# Patient Record
Sex: Female | Born: 1965 | Race: Black or African American | Hispanic: No | Marital: Single | Smoking: Current every day smoker
Health system: Southern US, Community
[De-identification: ages and names within clinical notes are randomized; demographics above are authoritative.]

## PROBLEM LIST (undated history)

## (undated) DIAGNOSIS — E876 Hypokalemia: Secondary | ICD-10-CM

## (undated) DIAGNOSIS — I471 Supraventricular tachycardia, unspecified: Secondary | ICD-10-CM

## (undated) DIAGNOSIS — I10 Essential (primary) hypertension: Secondary | ICD-10-CM

## (undated) DIAGNOSIS — K219 Gastro-esophageal reflux disease without esophagitis: Secondary | ICD-10-CM

## (undated) DIAGNOSIS — I4719 Other supraventricular tachycardia: Secondary | ICD-10-CM

## (undated) DIAGNOSIS — I4891 Unspecified atrial fibrillation: Secondary | ICD-10-CM

## (undated) HISTORY — PX: APPENDECTOMY: SHX54

## (undated) HISTORY — PX: ABDOMINAL HYSTERECTOMY: SHX81

## (undated) HISTORY — PX: EYE SURGERY: SHX253

---

## 2008-03-15 ENCOUNTER — Emergency Department (HOSPITAL_BASED_OUTPATIENT_CLINIC_OR_DEPARTMENT_OTHER): Admission: EM | Admit: 2008-03-15 | Discharge: 2008-03-15 | Payer: Self-pay | Admitting: Emergency Medicine

## 2008-07-21 ENCOUNTER — Emergency Department (HOSPITAL_BASED_OUTPATIENT_CLINIC_OR_DEPARTMENT_OTHER): Admission: EM | Admit: 2008-07-21 | Discharge: 2008-07-21 | Payer: Self-pay | Admitting: Emergency Medicine

## 2008-10-22 ENCOUNTER — Emergency Department (HOSPITAL_BASED_OUTPATIENT_CLINIC_OR_DEPARTMENT_OTHER): Admission: EM | Admit: 2008-10-22 | Discharge: 2008-10-22 | Payer: Self-pay | Admitting: Emergency Medicine

## 2009-02-11 ENCOUNTER — Emergency Department (HOSPITAL_BASED_OUTPATIENT_CLINIC_OR_DEPARTMENT_OTHER): Admission: EM | Admit: 2009-02-11 | Discharge: 2009-02-11 | Payer: Self-pay | Admitting: Emergency Medicine

## 2009-02-21 ENCOUNTER — Emergency Department (HOSPITAL_BASED_OUTPATIENT_CLINIC_OR_DEPARTMENT_OTHER): Admission: EM | Admit: 2009-02-21 | Discharge: 2009-02-21 | Payer: Self-pay | Admitting: Emergency Medicine

## 2010-04-14 LAB — URINALYSIS, ROUTINE W REFLEX MICROSCOPIC
Bilirubin Urine: NEGATIVE
Glucose, UA: NEGATIVE mg/dL
Ketones, ur: NEGATIVE mg/dL
Nitrite: NEGATIVE
Protein, ur: 100 mg/dL — AB
Protein, ur: NEGATIVE mg/dL
Urobilinogen, UA: 1 mg/dL (ref 0.0–1.0)
pH: 7 (ref 5.0–8.0)

## 2010-04-14 LAB — URINE CULTURE: Colony Count: >=100000

## 2010-04-14 LAB — URINE MICROSCOPIC-ADD ON

## 2011-03-02 ENCOUNTER — Emergency Department (HOSPITAL_BASED_OUTPATIENT_CLINIC_OR_DEPARTMENT_OTHER)
Admission: EM | Admit: 2011-03-02 | Discharge: 2011-03-02 | Disposition: A | Discharge status: Home / Self Care | Payer: PRIVATE HEALTH INSURANCE | Attending: Emergency Medicine | Admitting: Emergency Medicine

## 2011-03-02 ENCOUNTER — Encounter (HOSPITAL_BASED_OUTPATIENT_CLINIC_OR_DEPARTMENT_OTHER): Payer: Self-pay | Admitting: *Deleted

## 2011-03-02 DIAGNOSIS — I10 Essential (primary) hypertension: Secondary | ICD-10-CM | POA: Insufficient documentation

## 2011-03-02 DIAGNOSIS — A599 Trichomoniasis, unspecified: Secondary | ICD-10-CM | POA: Insufficient documentation

## 2011-03-02 DIAGNOSIS — N390 Urinary tract infection, site not specified: Secondary | ICD-10-CM | POA: Insufficient documentation

## 2011-03-02 DIAGNOSIS — F17200 Nicotine dependence, unspecified, uncomplicated: Secondary | ICD-10-CM | POA: Insufficient documentation

## 2011-03-02 DIAGNOSIS — M549 Dorsalgia, unspecified: Secondary | ICD-10-CM | POA: Insufficient documentation

## 2011-03-02 HISTORY — DX: Essential (primary) hypertension: I10

## 2011-03-02 LAB — URINE MICROSCOPIC-ADD ON

## 2011-03-02 LAB — URINALYSIS, ROUTINE W REFLEX MICROSCOPIC
Bilirubin Urine: NEGATIVE
Ketones, ur: NEGATIVE mg/dL
Nitrite: NEGATIVE
Protein, ur: NEGATIVE mg/dL
pH: 6 (ref 5.0–8.0)

## 2011-03-02 LAB — WET PREP, GENITAL: Yeast Wet Prep HPF POC: NONE SEEN

## 2011-03-02 MED ORDER — CEFTRIAXONE SODIUM 250 MG IJ SOLR
250.0000 mg | Freq: Once | INTRAMUSCULAR | Status: AC
  Administered 2011-03-02: 250 mg via INTRAMUSCULAR
  Filled 2011-03-02: qty 250

## 2011-03-02 MED ORDER — AZITHROMYCIN 1 G PO PACK
1.0000 g | PACK | Freq: Once | ORAL | Status: AC
  Administered 2011-03-02: 1 g via ORAL
  Filled 2011-03-02: qty 1

## 2011-03-02 MED ORDER — DOXYCYCLINE HYCLATE 100 MG PO CAPS: 100.0000 mg | ORAL_CAPSULE | Freq: Two times a day (BID) | ORAL | Status: AC

## 2011-03-02 MED ORDER — METOPROLOL TARTRATE 50 MG PO TABS: 50.0000 mg | ORAL_TABLET | Freq: Two times a day (BID) | ORAL | Status: DC

## 2011-03-02 MED ORDER — ONDANSETRON 8 MG PO TBDP
8.0000 mg | ORAL_TABLET | Freq: Once | ORAL | Status: AC
  Administered 2011-03-02: 8 mg via ORAL
  Filled 2011-03-02: qty 1

## 2011-03-02 MED ORDER — LIDOCAINE HCL (PF) 1 % IJ SOLN
INTRAMUSCULAR | Status: AC
  Administered 2011-03-02: 2.1 mL
  Filled 2011-03-02: qty 5

## 2011-03-02 MED ORDER — METRONIDAZOLE 500 MG PO TABS
2000.0000 mg | ORAL_TABLET | Freq: Once | ORAL | Status: AC
  Administered 2011-03-02: 2000 mg via ORAL
  Filled 2011-03-02: qty 4

## 2011-03-02 NOTE — ED Provider Notes (Signed)
History     CSN: [id number]  Arrival date & time 03/02/11  0435   First MD Initiated Contact with Patient 03/02/11 0448      Chief Complaint  Patient presents with  . Back Pain  . Vaginal Discharge    (Consider location/radiation/quality/duration/timing/severity/associated sxs/prior treatment) HPI This 46 year old female states she ran out of her blood pressure medications a couple years ago has not taken any since that time but recently got insurance and wants to restart blood pressure medications, she also has a chronic vaginal discharge for several months if not longer and was diagnosed with bacterial vaginosis but never got a prescription filled for that, and she also states that after she's been working for 12 hours standing still on a concrete floor her low back starts to hurt but it is not hurting her this morning and she just got off work, when she does have back pain it can last for hours but does not radiate down her legs and is not associated with weakness or numbness or change in bowel or bladder function she also has no abdominal pain chest pain cough shortness of breath rash fevers or other concerns. She has no headache or confusion no change in speech or vision. Past Medical History  Diagnosis Date  . Hypertension     Past Surgical History  Procedure Date  . Abdominal hysterectomy   . Appendectomy     History reviewed. No pertinent family history.  History  Substance Use Topics  . Smoking status: Current Everyday Smoker  . Smokeless tobacco: Not on file  . Alcohol Use: No    OB History    Grav Para Term Preterm Abortions TAB SAB Ect Mult Living                  Review of Systems  Constitutional: Negative for fever.       10 Systems reviewed and are negative for acute change except as noted in the HPI.  HENT: Negative for congestion.   Eyes: Negative for discharge and redness.  Respiratory: Negative for cough and shortness of breath.   Cardiovascular:  Negative for chest pain.  Gastrointestinal: Negative for vomiting, abdominal pain and diarrhea.  Genitourinary: Positive for vaginal discharge. Negative for dysuria, hematuria, flank pain and vaginal bleeding.  Musculoskeletal: Positive for back pain.  Skin: Negative for rash.  Neurological: Negative for dizziness, syncope, facial asymmetry, speech difficulty, weakness, numbness and headaches.  Psychiatric/Behavioral:       No behavior change.    Allergies  Review of patient's allergies indicates not on file.  Home Medications   Current Outpatient Rx  Name Route Sig Dispense Refill  . DOXYCYCLINE HYCLATE 100 MG PO CAPS Oral Take 1 capsule (100 mg total) by mouth 2 (two) times daily. One po bid x 7 days 14 capsule 0  . METOPROLOL TARTRATE 50 MG PO TABS Oral Take 1 tablet (50 mg total) by mouth 2 (two) times daily. 60 tablet 0    BP 186/108  Pulse 90  Temp(Src) 98.2 F (36.8 C) (Oral)  Resp 16  SpO2 100%  Physical Exam  Nursing note and vitals reviewed. Constitutional:       Awake, alert, nontoxic appearance.  HENT:  Head: Atraumatic.  Eyes: Right eye exhibits no discharge. Left eye exhibits no discharge.  Neck: Neck supple.  Cardiovascular: Normal rate and regular rhythm.   No murmur heard. Pulmonary/Chest: Effort normal and breath sounds normal. No respiratory distress. She has no wheezes. She has  no rales. She exhibits no tenderness.  Abdominal: Soft. Bowel sounds are normal. She exhibits no mass. There is no tenderness. There is no rebound and no guarding.       Back is nontender  Genitourinary: Vaginal discharge found.       Chaperone is present with greenish yellow discharge present in the vaginal vault with no tenderness to bimanual examination or masses palpated  Musculoskeletal: She exhibits no tenderness.       Baseline ROM, no obvious new focal weakness.  Neurological: She is alert.       Mental status and motor strength appears baseline for patient and  situation.  Skin: No rash noted.  Psychiatric: She has a normal mood and affect.    ED Course  Procedures (including critical care time)  Labs Reviewed  URINALYSIS, ROUTINE W REFLEX MICROSCOPIC - Abnormal; Notable for the following:    APPearance CLOUDY (*)    Hgb urine dipstick SMALL (*)    Leukocytes, UA LARGE (*)    All other components within normal limits  WET PREP, GENITAL - Abnormal; Notable for the following:    Trich, Wet Prep MANY (*)    Clue Cells Wet Prep HPF POC FEW (*)    WBC, Wet Prep HPF POC TOO NUMEROUS TO COUNT (*) BACTERIA- TOO NUMEROUS TO COUNT   All other components within normal limits  URINE MICROSCOPIC-ADD ON - Abnormal; Notable for the following:    Bacteria, UA MANY (*)    All other components within normal limits  GC/CHLAMYDIA PROBE AMP, GENITAL   No results found.   1. Trichomoniasis   2. Urinary tract infection   3. Hypertension       MDM  I doubt any other EMC precluding discharge at this time including, but not necessarily limited to the following:sepsis.        Hurman Horn, MD 03/03/11 2138

## 2011-03-02 NOTE — ED Notes (Signed)
Pt states that she has had BV x 6 months and a kidney infection x 6 months and her back hurts "real bad"

## 2011-08-15 ENCOUNTER — Emergency Department (HOSPITAL_BASED_OUTPATIENT_CLINIC_OR_DEPARTMENT_OTHER)
Admission: EM | Admit: 2011-08-15 | Discharge: 2011-08-15 | Disposition: A | Discharge status: Home / Self Care | Payer: Self-pay | Attending: Emergency Medicine | Admitting: Emergency Medicine

## 2011-08-15 ENCOUNTER — Encounter (HOSPITAL_BASED_OUTPATIENT_CLINIC_OR_DEPARTMENT_OTHER): Payer: Self-pay | Admitting: *Deleted

## 2011-08-15 ENCOUNTER — Emergency Department (HOSPITAL_BASED_OUTPATIENT_CLINIC_OR_DEPARTMENT_OTHER): Payer: Self-pay

## 2011-08-15 DIAGNOSIS — Z9114 Patient's other noncompliance with medication regimen: Secondary | ICD-10-CM

## 2011-08-15 DIAGNOSIS — E876 Hypokalemia: Secondary | ICD-10-CM | POA: Insufficient documentation

## 2011-08-15 DIAGNOSIS — Z9089 Acquired absence of other organs: Secondary | ICD-10-CM | POA: Insufficient documentation

## 2011-08-15 DIAGNOSIS — F17200 Nicotine dependence, unspecified, uncomplicated: Secondary | ICD-10-CM | POA: Insufficient documentation

## 2011-08-15 DIAGNOSIS — Z9119 Patient's noncompliance with other medical treatment and regimen: Secondary | ICD-10-CM | POA: Insufficient documentation

## 2011-08-15 DIAGNOSIS — I10 Essential (primary) hypertension: Secondary | ICD-10-CM | POA: Insufficient documentation

## 2011-08-15 DIAGNOSIS — R002 Palpitations: Secondary | ICD-10-CM | POA: Insufficient documentation

## 2011-08-15 DIAGNOSIS — Z91199 Patient's noncompliance with other medical treatment and regimen due to unspecified reason: Secondary | ICD-10-CM | POA: Insufficient documentation

## 2011-08-15 HISTORY — DX: Hypokalemia: E87.6

## 2011-08-15 LAB — CBC WITH DIFFERENTIAL/PLATELET
Basophils Relative: 0 % (ref 0–1)
Eosinophils Absolute: 0.1 10*3/uL (ref 0.0–0.7)
HCT: 39.3 % (ref 36.0–46.0)
Hemoglobin: 13.8 g/dL (ref 12.0–15.0)
MCH: 31 pg (ref 26.0–34.0)
MCHC: 35.1 g/dL (ref 30.0–36.0)
Monocytes Absolute: 0.9 10*3/uL (ref 0.1–1.0)
Monocytes Relative: 9 % (ref 3–12)
Neutrophils Relative %: 45 % (ref 43–77)
RDW: 13 % (ref 11.5–15.5)

## 2011-08-15 LAB — BASIC METABOLIC PANEL
BUN: 13 mg/dL (ref 6–23)
Calcium: 9.3 mg/dL (ref 8.4–10.5)
Creatinine, Ser: 0.9 mg/dL (ref 0.50–1.10)
GFR calc Af Amer: 88 mL/min — ABNORMAL LOW (ref >90)
GFR calc non Af Amer: 76 mL/min — ABNORMAL LOW (ref >90)

## 2011-08-15 MED ORDER — HYDROCHLOROTHIAZIDE 25 MG PO TABS: 25.0000 mg | ORAL_TABLET | Freq: Every day | ORAL | Status: DC

## 2011-08-15 MED ORDER — HYDROCHLOROTHIAZIDE 25 MG PO TABS
25.0000 mg | ORAL_TABLET | Freq: Every day | ORAL | Status: DC
  Administered 2011-08-15: 25 mg via ORAL
  Filled 2011-08-15: qty 1

## 2011-08-15 MED ORDER — METOPROLOL TARTRATE 50 MG PO TABS
ORAL_TABLET | ORAL | Status: AC
  Administered 2011-08-15: 50 mg via ORAL
  Filled 2011-08-15: qty 1

## 2011-08-15 MED ORDER — POTASSIUM CHLORIDE CRYS ER 20 MEQ PO TBCR
40.0000 meq | EXTENDED_RELEASE_TABLET | Freq: Once | ORAL | Status: AC
  Administered 2011-08-15: 40 meq via ORAL
  Filled 2011-08-15: qty 1

## 2011-08-15 MED ORDER — METOPROLOL TARTRATE 12.5 MG HALF TABLET
12.5000 mg | ORAL_TABLET | Freq: Once | ORAL | Status: AC
  Administered 2011-08-15: 50 mg via ORAL
  Filled 2011-08-15: qty 1

## 2011-08-15 MED ORDER — ASPIRIN 81 MG PO CHEW
324.0000 mg | CHEWABLE_TABLET | Freq: Once | ORAL | Status: AC
  Administered 2011-08-15: 324 mg via ORAL
  Filled 2011-08-15: qty 4

## 2011-08-15 MED ORDER — POTASSIUM CHLORIDE ER 10 MEQ PO TBCR: 10.0000 meq | EXTENDED_RELEASE_TABLET | Freq: Two times a day (BID) | ORAL | Status: DC

## 2011-08-15 NOTE — ED Notes (Signed)
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

## 2011-08-15 NOTE — ED Notes (Signed)
Return from XR

## 2011-08-15 NOTE — ED Provider Notes (Signed)
History     CSN: [id number]  Arrival date & time 08/15/11  Sarah Larson   First MD Initiated Contact with Patient 08/15/11 2006      Chief Complaint  Patient presents with  . Chest Pain    (Consider location/radiation/quality/duration/timing/severity/associated sxs/prior treatment) HPI  Patient complaining of palpitations for the past year with some increase in the past couple days. She states that she has had some nausea and shortness of breath. She has cough at baseline and has had some increasing coughing. She is a smoker. She denies any chest pain to me. I have reviewed this as it was in the nurses notes. She states it is more that she feels a palpitation in her chest. She states she does get somewhat sweaty at times but has been out in the heat. She has been taking her antihypertensives had a decreased amount do to feeling bad after taking the Lopressor and not having the money to refill her medicine she is only taking her hydrochlorothiazide intermittently. She has had some headache and general body aches. She does have a history of hypokalemia and is concerned that her potassium is low.  Past Medical History  Diagnosis Date  . Hypertension   . Hypokalemia     Past Surgical History  Procedure Date  . Abdominal hysterectomy   . Appendectomy     No family history on file.  History  Substance Use Topics  . Smoking status: Current Everyday Smoker  . Smokeless tobacco: Not on file  . Alcohol Use: Yes     occasionally    OB History    Grav Para Term Preterm Abortions TAB SAB Ect Mult Living                  Review of Systems  All other systems reviewed and are negative.    Allergies  Review of patient's allergies indicates no known allergies.  Home Medications   Current Outpatient Rx  Name Route Sig Dispense Refill  . ACETAMINOPHEN 500 MG PO TABS Oral Take 500 mg by mouth every 6 (six) hours as needed. For headache.    Marland Kitchen HYDROCHLOROTHIAZIDE 25 MG PO TABS Oral Take  25 mg by mouth daily.      BP 159/91  Pulse 80  Temp 98.8 F (37.1 C) (Oral)  Resp 18  SpO2 100%  Physical Exam  Nursing note and vitals reviewed. Constitutional: She appears well-developed and well-nourished.  HENT:  Head: Normocephalic and atraumatic.  Eyes: Conjunctivae and EOM are normal. Pupils are equal, round, and reactive to light.  Neck: Normal range of motion. Neck supple.  Cardiovascular: Normal rate, regular rhythm, normal heart sounds and intact distal pulses.   Pulmonary/Chest: Effort normal and breath sounds normal.  Abdominal: Soft. Bowel sounds are normal.  Musculoskeletal: Normal range of motion.  Neurological: She is alert.  Skin: Skin is warm and dry.  Psychiatric: She has a normal mood and affect. Thought content normal.    ED Course  Procedures (including critical care time)  Labs Reviewed  CBC WITH DIFFERENTIAL - Abnormal; Notable for the following:    Lymphs Abs 4.1 (*)     All other components within normal limits  BASIC METABOLIC PANEL - Abnormal; Notable for the following:    Potassium 3.2 (*)     GFR calc non Af Amer 76 (*)     GFR calc Af Amer 88 (*)     All other components within normal limits  TROPONIN I   Dg  Chest 2 View  08/15/2011  *RADIOLOGY REPORT*  Clinical Data: Chest pain, palpitations  CHEST - 2 VIEW  Comparison: None.  Findings: Lungs are clear. No pleural effusion or pneumothorax.  Cardiomediastinal silhouette is within normal limits.  Visualized osseous structures are within normal limits.  IMPRESSION: No evidence of acute cardiopulmonary disease.  Original Report Authenticated By: Charline Bills, M.D.     No diagnosis found.   Rate: 87  Rhythm: normal sinus rhythm  QRS Axis: normal  Intervals: normal  ST/T Wave abnormalities: normal  Conduction Disutrbances: none  Narrative Interpretation: unremarkable   MDM  Patient with normal labs except for the potassium being low at 3.2. She'll have potassium replenishment  here and is given a prescription for this. She also received Lopressor and hydrochlorothiazide here. She initially was hypertensive and this is likely her baseline. I discussed the necessity of taking her medications obtaining followup. She is advised to stop smoking and take her medications as prescribed. She is advised to return if she has any worsening of her symptoms. I think it is not consistent with coronary artery disease or myocardial infarction at this time. Her EKG does not show any acute ischemic changes and she has not had any chest pain. Date: 08/15/2011           Hilario Quarry, MD 08/15/11 2106

## 2011-08-15 NOTE — ED Notes (Signed)
Intermittent episodes of palpitations x1 year. States the episodes are becoming more frequent and last longer. States she also experiences chest pains, nasuea, SOB and diaphoresis at times when these episodes occur.  Pt has seen a doctor for same and was told she had low potassium.

## 2013-08-26 ENCOUNTER — Encounter (HOSPITAL_BASED_OUTPATIENT_CLINIC_OR_DEPARTMENT_OTHER): Payer: Self-pay | Admitting: Emergency Medicine

## 2013-08-26 ENCOUNTER — Emergency Department (HOSPITAL_BASED_OUTPATIENT_CLINIC_OR_DEPARTMENT_OTHER)
Admission: EM | Admit: 2013-08-26 | Discharge: 2013-08-26 | Disposition: A | Discharge status: Home / Self Care | Payer: PRIVATE HEALTH INSURANCE | Attending: Emergency Medicine | Admitting: Emergency Medicine

## 2013-08-26 DIAGNOSIS — F17200 Nicotine dependence, unspecified, uncomplicated: Secondary | ICD-10-CM | POA: Insufficient documentation

## 2013-08-26 DIAGNOSIS — Z792 Long term (current) use of antibiotics: Secondary | ICD-10-CM | POA: Insufficient documentation

## 2013-08-26 DIAGNOSIS — I10 Essential (primary) hypertension: Secondary | ICD-10-CM | POA: Insufficient documentation

## 2013-08-26 DIAGNOSIS — I4891 Unspecified atrial fibrillation: Secondary | ICD-10-CM | POA: Insufficient documentation

## 2013-08-26 DIAGNOSIS — Z79899 Other long term (current) drug therapy: Secondary | ICD-10-CM | POA: Insufficient documentation

## 2013-08-26 DIAGNOSIS — H04302 Unspecified dacryocystitis of left lacrimal passage: Secondary | ICD-10-CM

## 2013-08-26 DIAGNOSIS — H04309 Unspecified dacryocystitis of unspecified lacrimal passage: Secondary | ICD-10-CM | POA: Insufficient documentation

## 2013-08-26 DIAGNOSIS — H5789 Other specified disorders of eye and adnexa: Secondary | ICD-10-CM | POA: Insufficient documentation

## 2013-08-26 HISTORY — DX: Unspecified atrial fibrillation: I48.91

## 2013-08-26 MED ORDER — MOXIFLOXACIN HCL 0.5 % OP SOLN: 1.0000 [drp] | Freq: Three times a day (TID) | OPHTHALMIC | Status: DC

## 2013-08-26 MED ORDER — FLUORESCEIN SODIUM 1 MG OP STRP
ORAL_STRIP | OPHTHALMIC | Status: AC
  Administered 2013-08-26: 1
  Filled 2013-08-26: qty 1

## 2013-08-26 NOTE — Discharge Instructions (Signed)
Dacryocystitis  Dacryocystitis is an infection of the tear sac (lacrimal sac). The lacrimal sac lies between the inner corner of the eyelids and the nose. The glands of the eyelids produce tears. This is to keep the surface of the eye wet and protect it. These tears drain from the surface of the eyes through a duct in each lid (lacrimal ducts), then through the lacrimal sac into the nose. The tears are then swallowed. If the lacrimal sacs become blocked, bacteria begin to buildup. The lacrimal sacs can become infected. Dacryocystitis may be sudden (acute) or long-lasting (chronic). This problem is most common in infants because the tear ducts are not fully developed and clog easily. In that case, infants may have episodes of tearing and infection. However, in most cases, the problem gets better as the infant grows. CAUSES  The cause is often unknown. Known causes can include:  Malformation of the lacrimal sac.  Injury to the eye.  Eye infection.  Injury or inflammation of the nasal passages. SYMPTOMS   Usually only one eye is involved.  Excessive tearing and watering from the involved eye.  Tenderness, redness, and swelling of the lower lid near the nose.  A sore, red, inflamed bump on the inner corner of the lower lid. DIAGNOSIS  A diagnosis is made after an eye exam to see how much blockage is present and if the surface of the eye is also infected. A culture of the fluid from the lacrimal sac may be examined to find if a specific infection is present. TREATMENT  Treatment depends on:   The person's age.  Whether or not the infection is chronic or acute.  The amount of blockage that is present. Additional treatment Sometimes massaging the area (starting from the inside of the eye and gently massaging down toward the nose) will improve the condition, combined with antibiotic eyedrops or ointments. If massaging the area does not work, it may be necessary to probe the ducts and open up  the drainage system. While this is easily done in the office in adults, probing usually has to be done under general anesthesia in infants.  If the blockage cannot be cleared by probing, surgery may be needed under general anesthesia to create a direct opening for tears to flow between the lacrimal sac and the inside of the nose (dacryocystorhinostomy, DCR). HOME CARE INSTRUCTIONS   Use any antibiotic eyedrops, ointment, or pills as directed by the caregiver. Finish all medicines even if the symptoms start to get better.  Massage the lacrimal sac as directed by the caregiver. SEEK IMMEDIATE MEDICAL CARE IF:   There is increased pain, swelling, redness, or drainage from the eye.  Muscle aches, chills, or a general sick feeling develop.  A fever or persistent symptoms develop for more than 2-3 days.  The fever and symptoms suddenly get worse. MAKE SURE YOU:   Understand these instructions.  Will watch your condition.  Will get help right away if you are not doing well or get worse. Document Released: 01/11/2000 Document Revised: 05/30/2013 Document Reviewed: 06/16/2011 [employer] Patient Information 2015 [employer]. This information is not intended to replace advice given to you by your health care provider. Make sure you discuss any questions you have with your health care provider.  

## 2013-08-26 NOTE — ED Notes (Signed)
Left eye drainage, matting in am.  Pt states it has been going on for almost one year.  Some dryness and itching.  No previous treatment.

## 2013-08-26 NOTE — ED Provider Notes (Signed)
CSN: [id number]     Arrival date & time 08/26/13  6295 History   First MD Initiated Contact with Patient 08/26/13 [phone]     Chief Complaint  Patient presents with  . Eye Problem     (Consider location/radiation/quality/duration/timing/severity/associated sxs/prior Treatment) HPI Comments: Patient presents with left eye drainage. She states she's had problems with this I've for about a year but it's worsened in the last month. She describes a yellowish drainage from her eyes. She denies any eye pain or vision changes. She denies any sinus congestion or fevers. She denies any injuries or exposures to the eye. She has seen her eye doctor who is in [facility] several months ago and she was told that she had an eye infection.  She was not given antibiotic eye drops.  Patient is a 48 y.o. female presenting with eye problem.  Eye Problem Associated symptoms: discharge and itching (occasional)   Associated symptoms: no headaches, no nausea, no photophobia, no redness and no vomiting     Past Medical History  Diagnosis Date  . Hypertension   . Hypokalemia   . A-fib    Past Surgical History  Procedure Laterality Date  . Abdominal hysterectomy    . Appendectomy     No family history on file. History  Substance Use Topics  . Smoking status: Current Every Day Smoker  . Smokeless tobacco: Not on file  . Alcohol Use: Yes     Comment: occasionally   OB History   Grav Para Term Preterm Abortions TAB SAB Ect Mult Living                 Review of Systems  Constitutional: Negative for fatigue.  HENT: Negative for facial swelling.   Eyes: Positive for discharge and itching (occasional). Negative for photophobia, pain, redness and visual disturbance.  Gastrointestinal: Negative for nausea and vomiting.  Neurological: Negative for headaches.      Allergies  Review of patient's allergies indicates no known allergies.  Home Medications   Prior to Admission medications   Medication Sig  Start Date End Date Taking? Authorizing Provider  diltiazem (DILACOR XR) 240 MG 24 hr capsule Take 240 mg by mouth daily.   Yes Historical Provider, MD  metoprolol succinate (TOPROL-XL) 25 MG 24 hr tablet Take 25 mg by mouth daily.   Yes Historical Provider, MD  moxifloxacin (VIGAMOX) 0.5 % ophthalmic solution Place 1 drop into the left eye 3 (three) times daily. For 7 days 08/26/13   Rolan Bucco, MD   BP 133/83  Pulse 73  Temp(Src) 98.3 F (36.8 C) (Oral)  Resp 20  Ht 5\' 3"  (1.6 m)  Wt 177 lb (80.287 kg)  BMI 31.36 kg/m2  SpO2 100% Physical Exam  Constitutional: She appears well-developed and well-nourished.  Eyes: Conjunctivae and EOM are normal. Pupils are equal, round, and reactive to light. Right eye exhibits no discharge. Left eye exhibits discharge (Yellowish discharge from the left lacrimal duct with inflammatory changes around the duct). Left conjunctiva is not injected. Left conjunctiva has no hemorrhage. No scleral icterus. Left eye exhibits normal extraocular motion.  Slit lamp exam:      The left eye shows no corneal abrasion, no corneal flare, no corneal ulcer, no foreign body and no fluorescein uptake.  No swelling or redness around the eye., no staining areas/dendrites on slit lamp exam  Cardiovascular: Normal rate.   Pulmonary/Chest: Effort normal.    ED Course  Procedures (including critical care time) Labs Review Labs  Reviewed - No data to display  Imaging Review No results found.   EKG Interpretation None      MDM   Final diagnoses:  Dacrocystitis, left    Patient appears to have an infection and drainage from the left lacrimal duct. She has no signs of orbital or periorbital cellulitis. She has no vision changes. She was discharged home in good condition and was given a prescription for Vigamox eyedrops. She was advised to use warm compresses to the area. She was advised to followup with her eye doctor for a recheck within the week.    Rolan BuccoMelanie  Abbott Jasinski, MD 08/26/13 1005

## 2014-11-26 ENCOUNTER — Encounter (HOSPITAL_BASED_OUTPATIENT_CLINIC_OR_DEPARTMENT_OTHER): Payer: Self-pay | Admitting: *Deleted

## 2014-11-26 ENCOUNTER — Emergency Department (HOSPITAL_BASED_OUTPATIENT_CLINIC_OR_DEPARTMENT_OTHER)
Admission: EM | Admit: 2014-11-26 | Discharge: 2014-11-26 | Disposition: A | Discharge status: Home / Self Care | Payer: PRIVATE HEALTH INSURANCE | Attending: Emergency Medicine | Admitting: Emergency Medicine

## 2014-11-26 DIAGNOSIS — K029 Dental caries, unspecified: Secondary | ICD-10-CM | POA: Insufficient documentation

## 2014-11-26 DIAGNOSIS — I4891 Unspecified atrial fibrillation: Secondary | ICD-10-CM | POA: Insufficient documentation

## 2014-11-26 DIAGNOSIS — Z720 Tobacco use: Secondary | ICD-10-CM | POA: Insufficient documentation

## 2014-11-26 DIAGNOSIS — K0889 Other specified disorders of teeth and supporting structures: Secondary | ICD-10-CM | POA: Insufficient documentation

## 2014-11-26 DIAGNOSIS — Z8639 Personal history of other endocrine, nutritional and metabolic disease: Secondary | ICD-10-CM | POA: Insufficient documentation

## 2014-11-26 DIAGNOSIS — I10 Essential (primary) hypertension: Secondary | ICD-10-CM | POA: Insufficient documentation

## 2014-11-26 DIAGNOSIS — K0381 Cracked tooth: Secondary | ICD-10-CM | POA: Insufficient documentation

## 2014-11-26 DIAGNOSIS — Z79899 Other long term (current) drug therapy: Secondary | ICD-10-CM | POA: Insufficient documentation

## 2014-11-26 MED ORDER — PENICILLIN V POTASSIUM 500 MG PO TABS
500.0000 mg | ORAL_TABLET | Freq: Three times a day (TID) | ORAL | Status: DC
Start: 2014-11-26 — End: 2015-04-24

## 2014-11-26 NOTE — ED Provider Notes (Signed)
CSN: [id number]     Arrival date & time 11/26/14  0435 History   First MD Initiated Contact with Patient 11/26/14 0447     No chief complaint on file.    (Consider location/radiation/quality/duration/timing/severity/associated sxs/prior Treatment) HPI Comments: Patient is a 49 year old female who presents with complaints of dental infection. She reports swelling to the lower gum where a tooth is broken off and become impacted within the skin. He reports swelling but does not report much discomfort. She denies any fevers or chills.  The history is provided by the patient.    Past Medical History  Diagnosis Date  . Hypertension   . Hypokalemia   . A-fib    Past Surgical History  Procedure Laterality Date  . Abdominal hysterectomy    . Appendectomy     No family history on file. Social History  Substance Use Topics  . Smoking status: Current Every Day Smoker  . Smokeless tobacco: Not on file  . Alcohol Use: Yes     Comment: occasionally   OB History    No data available     Review of Systems  All other systems reviewed and are negative.     Allergies  Review of patient's allergies indicates no known allergies.  Home Medications   Prior to Admission medications   Medication Sig Start Date End Date Taking? Authorizing Provider  diltiazem (DILACOR XR) 240 MG 24 hr capsule Take 240 mg by mouth daily.    Historical Provider, MD  metoprolol succinate (TOPROL-XL) 25 MG 24 hr tablet Take 25 mg by mouth daily.    Historical Provider, MD  moxifloxacin (VIGAMOX) 0.5 % ophthalmic solution Place 1 drop into the left eye 3 (three) times daily. For 7 days 08/26/13   Rolan BuccoMelanie Belfi, MD   BP 145/94 mmHg  Pulse 73  Temp(Src) 98.5 F (36.9 C) (Oral)  Resp 16  Ht 5\' 3"  (1.6 m)  Wt 140 lb (63.504 kg)  BMI 24.81 kg/m2  SpO2 100% Physical Exam  Constitutional: She is oriented to person, place, and time. She appears well-developed and well-nourished. No distress.  HENT:  Head:  Normocephalic and atraumatic.  The left lower canine tooth is noted to be broken off at the gumline. There is some gingival inflammation, however no evidence for an abscess. The soft tissues of the neck are soft and there is no crepitus.  Neck: Normal range of motion. Neck supple.  Neurological: She is alert and oriented to person, place, and time.  Skin: Skin is warm and dry. She is not diaphoretic.  Nursing note and vitals reviewed.   ED Course  Procedures (including critical care time) Labs Review Labs Reviewed - No data to display  Imaging Review No results found. I have personally reviewed and evaluated these images and lab results as part of my medical decision-making.   EKG Interpretation None      MDM   Final diagnoses:  None    We'll treat with antibiotics and provide follow-up information for a dentist.    Geoffery Lyonsouglas Brenen Beigel, MD 11/26/14 [phone]

## 2014-11-26 NOTE — ED Notes (Signed)
Pt c/o feeling as if she has a lower left jaw abscess. Denies any pain. States she can feel the swelling and wanted to get it checked out. Denies any fevers. States she has been treated with antibiotics within the past 2 months for the same.

## 2014-11-26 NOTE — Discharge Instructions (Signed)
Penicillin as prescribed.  Follow-up with a dentist this week to be rechecked.   Dental Caries Dental caries (also called tooth decay) is the most common oral disease. It can occur at any age but is more common in children and young adults.  HOW DENTAL CARIES DEVELOPS  The process of decay begins when bacteria and foods (particularly sugars and starches) combine in your mouth to produce plaque. Plaque is a substance that sticks to the hard, outer surface of a tooth (enamel). The bacteria in plaque produce acids that attack enamel. These acids may also attack the root surface of a tooth (cementum) if it is exposed. Repeated attacks dissolve these surfaces and create holes in the tooth (cavities). If left untreated, the acids destroy the other layers of the tooth.  RISK FACTORS  Frequent sipping of sugary beverages.   Frequent snacking on sugary and starchy foods, especially those that easily get stuck in the teeth.   Poor oral hygiene.   Dry mouth.   Substance abuse such as methamphetamine abuse.   Broken or poor-fitting dental restorations.   Eating disorders.   Gastroesophageal reflux disease (GERD).   Certain radiation treatments to the head and neck. SYMPTOMS In the early stages of dental caries, symptoms are seldom present. Sometimes white, chalky areas may be seen on the enamel or other tooth layers. In later stages, symptoms may include:  Pits and holes on the enamel.  Toothache after sweet, hot, or cold foods or drinks are consumed.  Pain around the tooth.  Swelling around the tooth. DIAGNOSIS  Most of the time, dental caries is detected during a regular dental checkup. A diagnosis is made after a thorough medical and dental history is taken and the surfaces of your teeth are checked for signs of dental caries. Sometimes special instruments, such as lasers, are used to check for dental caries. Dental X-ray exams may be taken so that areas not visible to the eye  (such as between the contact areas of the teeth) can be checked for cavities.  TREATMENT  If dental caries is in its early stages, it may be reversed with a fluoride treatment or an application of a remineralizing agent at the dental office. Thorough brushing and flossing at home is needed to aid these treatments. If it is in its later stages, treatment depends on the location and extent of tooth destruction:   If a small area of the tooth has been destroyed, the destroyed area will be removed and cavities will be filled with a material such as gold, silver amalgam, or composite resin.   If a large area of the tooth has been destroyed, the destroyed area will be removed and a cap (crown) will be fitted over the remaining tooth structure.   If the center part of the tooth (pulp) is affected, a procedure called a root canal will be needed before a filling or crown can be placed.   If most of the tooth has been destroyed, the tooth may need to be pulled (extracted). HOME CARE INSTRUCTIONS You can prevent, stop, or reverse dental caries at home by practicing good oral hygiene. Good oral hygiene includes:  Thoroughly cleaning your teeth at least twice a day with a toothbrush and dental floss.   Using a fluoride toothpaste. A fluoride mouth rinse may also be used if recommended by your dentist or health care provider.   Restricting the amount of sugary and starchy foods and sugary liquids you consume.   Avoiding frequent snacking  on these foods and sipping of these liquids.   Keeping regular visits with a dentist for checkups and cleanings. PREVENTION   Practice good oral hygiene.  Consider a dental sealant. A dental sealant is a coating material that is applied by your dentist to the pits and grooves of teeth. The sealant prevents food from being trapped in them. It may protect the teeth for several years.  Ask about fluoride supplements if you live in a community without fluorinated  water or with water that has a low fluoride content. Use fluoride supplements as directed by your dentist or health care provider.  Allow fluoride varnish applications to teeth if directed by your dentist or health care provider.   This information is not intended to replace advice given to you by your health care provider. Make sure you discuss any questions you have with your health care provider.   Document Released: 10/05/2001 Document Revised: 02/03/2014 Document Reviewed: 01/16/2012 Elsevier Interactive Patient Education 2016 [employer]    Emergency Department Resource Guide 1) Find a Doctor and Pay Out of Pocket Although you won't have to find out who is covered by your insurance plan, it is a good idea to ask around and get recommendations. You will then need to call the office and see if the doctor you have chosen will accept you as a new patient and what types of options they offer for patients who are self-pay. Some doctors offer discounts or will set up payment plans for their patients who do not have insurance, but you will need to ask so you aren't surprised when you get to your appointment.  2) Contact Your Local Health Department Not all health departments have doctors that can see patients for sick visits, but many do, so it is worth a call to see if yours does. If you don't know where your local health department is, you can check in your phone book. The CDC also has a tool to help you locate your state's health department, and many state websites also have listings of all of their local health departments.  3) Find a Walk-in Clinic If your illness is not likely to be very severe or complicated, you may want to try a walk in clinic. These are popping up all over the country in pharmacies, drugstores, and shopping centers. They're usually staffed by nurse practitioners or physician assistants that have been trained to treat common illnesses and complaints. They're usually  fairly quick and inexpensive. However, if you have serious medical issues or chronic medical problems, these are probably not your best option.  No Primary Care Doctor: - Call Health Connect at  [phone] - they can help you locate a primary care doctor that  accepts your insurance, provides certain services, etc. - Physician Referral Service- [phone]  Chronic Pain Problems: Organization         Address  Phone   Notes  [facility]  [phone] Patients need to be referred by their primary care doctor.   Medication Assistance: Organization         Address  Phone   Notes  [facility] [address] [place], [place] [phone] --Must be a resident of [facility] [place] -- Must have NO insurance coverage whatsoever (no Medicaid/ Medicare, etc.) -- The pt. MUST have a primary care doctor that directs their care regularly and follows them in the community   [facility]  [phone]  [employer]  [phone]    Agencies that provide inexpensive medical care: Organization         Address  Phone   Notes  [facility]  [phone]   [facility]    [phone]   [facility] [address] [place], [place] [address] [phone]   [facility] [address] [place] [place] [phone]   Planned Parenthood    [phone]   [facility]    [phone]   [facility] and [facility]  [address], [place] Phone:  [phone], Fax:  [phone] Hours of Operation:  9 am - 6 pm, M-F.  Also accepts Medicaid/Medicare and self-pay.  [facility]  [address], [place] Phone: [phone], Fax: [phone]. Hours of Operation:  8:30 am - 5:30 pm, M-F.  Also accepts Medicaid and self-pay.  [facility] [place] [address] [place] Phone: [phone]   [facility] [address] Natasha Bence [place], [place] [phone], Ext. [phone] Mondays & Thursdays: 7-9 AM.  First 15 patients are seen on a first come, first serve basis.    Medicaid-accepting [facility] Providers:  Organization         Address  Phone   Notes  [facility] [address], [place] [phone] Also accepts self-pay patients.  [facility] [address] Laurell Josephs [place], [place]  [phone]   [facility] [address], [place] [phone]   [facility] [address], [place] [phone]   Renaye Rakers [address], [place]   [phone] Only accepts [place] Access [place] patients after they have their name applied to their card.   Self-Pay (no insurance) in [facility]:  Organization         Address  Phone   Notes  Sickle Cell Patients, [facility] [address] [place], [place] [phone]   [facility] [address] [place], [place] [phone]   [facility]  [address], [place] [phone]   [facility]/Dr. Osei-Bonsu  [address] [place] or [address], [place] [phone] Phone number for both [place] and [place] locations is the same.  [facility] [address] [place] [phone]   [facility] [address], [place] or [address] [phone]   [facility] [address] [place] [phone], phone; [phone], fax Sees patients 1st and 3rd Saturday of every month.  Must not qualify for public or private insurance (i.e. Medicaid, Medicare, [place] Health Choice, Veterans' Benefits)  Household income should be no more than 200% of the poverty level The clinic cannot treat you if  you are pregnant or think you are pregnant  Sexually transmitted diseases are not treated at the clinic.    Dental Care: Organization         Address  Phone  Notes  [facility] [address] [place], [place] [phone] Accepts children up to age 78 who are enrolled in [place] or [place] Health Choice; pregnant women with a Medicaid card;  and children who have applied for Medicaid or [place] Health Choice, but were declined, whose parents can pay a reduced fee at time of service.  [facility]  [address], [place] [phone] Accepts children up to age 71 who are enrolled in [place] or [place] Health Choice; pregnant women with a Medicaid card; and children who have applied for Medicaid or [place] Health Choice, but were declined, whose parents can pay a reduced fee at time of service.  [facility]  [address] [place], [place] [phone] Patients are seen by appointment only. Walk-ins are not accepted. [facility] will see patients 23 years of age and older. Monday - Tuesday (8am-5pm) Most Wednesdays (8:30-5pm) $30 per visit, cash only  [facility]  [address], [facility] [phone] Patients are seen by appointment only. Walk-ins are not accepted. [facility] will see patients 60 years of age and older. One Wednesday Evening (Monthly: Volunteer Based).  $30 per visit, cash only  [employer]  [phone] for adults; Children under age 77, call Graduate Pediatric Dentistry at [phone]. Children aged 25-14, please call [phone] to request a pediatric application.  Dental services are provided in all areas of dental care including fillings, crowns and bridges, complete and partial dentures, implants, gum treatment, root canals, and extractions. Preventive care is also provided. Treatment is  provided to both adults and children. Patients are selected via a lottery and there is often a waiting list.   [facility] [address] [place]  [phone] [email or web address]   [facility] [address] [place], [place] [phone], Ext. [phone] Second and Fourth Thursday of each month, opens at 6:30 AM; Clinic ends at 9 AM.  Patients are seen on a first-come first-served basis, and a limited number are seen during each clinic.   [facility]  [address] Ether Griffins [place], [place] [phone]   Eligibility Requirements You must have lived in [place], [place], or [place] counties for at least the last three months.   You cannot be eligible for state or federal sponsored [place], including [employer], [place], or [employer].   You generally cannot be eligible for healthcare insurance through your employer.    How to apply: Eligibility screenings are held every Tuesday and Wednesday afternoon from 1:00 pm until 4:00 pm. You do not need an appointment for the interview!  [facility] [address] [place], [place] [phone]   [facility] Health Department  [phone]   [facility] Health Department  [phone]   [facility]  [phone]    Behavioral Health Resources in the Community: Intensive Outpatient Programs Organization         Address  Phone  Notes  [facility] [address], [place], [place] [phone]   [facility] Outpatient [address], [place], [place] [phone]   [facility] [address], [place], [place]  [phone]   [facility] [address],  [place], [place] [phone] or [phone]   Substance Abuse Resources Organization         Address  Phone  Notes  [facility] Drug Services  [phone]   [facility]  [phone]   The  [place]  [phone]   [facility]  [phone]  Residential & Outpatient Substance Abuse Program  [phone]   Psychological Services Organization         Address  Phone  Notes  [facility] Behavioral Health  [phone]   [facility] Services  [phone]   [facility] [phone] [address], [place] [phone] or [phone]    Mobile Crisis Teams Organization         Address  Phone  Notes  [facility], Mobile Crisis Care Unit  [phone]   [facility]  [address] [place], [place] [phone]   Doristine Locks [address] [place] [phone]    Self-Help/Support Groups Organization         Address  Phone             Notes  [facility] [place] - variety of support groups  [phone]- I7437963 Call for more information  Narcotics Anonymous (NA), [facility] [address], [employer] [place]  2 meetings at this location   Statistician         Address  Phone  Notes  [facility] [address],    [place]  [phone]   [facility]  [address], [place] [address], [place], [place] [phone]   [facility] [address] [place], [place] [phone] Admissions: 8am-3pm M-F  [facility] [address]    [place], [place] [phone]   [facility] [address] [place], [place], [place] [phone]   [facility] [address],  [place], [place] [phone]   [facility] - Intensive Outpatient [address] Laurell Josephs [address], [place], [place] [phone]   [facility] ([facility] [address] [place]  [place], [place] [phone] or [phone]   [facility] ([facility]) [address], [place], [place] [phone] Accepts Medicaid  [facility] [address],  [place] [phone] Substance Abuse/Addiction Treatment     [facility] Organization         Address  Phone  Notes  [facility]  [phone]   Angie Fava, PhD [address] Ervin Knack [place], [place]   [phone] or [phone]   [facility] Behavioral   [address] [place], [place] [phone]   [facility] [address], [place], [place] [phone] Insurance/Medicaid/sponsorship through [facility] and Families [address]                                    [place], [place] [phone] Therapy/tele-psych/case  [facility] [address], [place] [phone]    Dr. Lolly Mustache  [phone]   [facility]  [employer] [facility], [facility] Dept. 1) [address]  2) [address] [place] 3)  [address], [place] [phone]  [phone]   [facility] Child Abuse Hotline 959-340-2694 or [phone] (After Hours)

## 2015-04-24 ENCOUNTER — Encounter (HOSPITAL_BASED_OUTPATIENT_CLINIC_OR_DEPARTMENT_OTHER): Payer: Self-pay | Admitting: *Deleted

## 2015-04-24 ENCOUNTER — Emergency Department (HOSPITAL_BASED_OUTPATIENT_CLINIC_OR_DEPARTMENT_OTHER)
Admission: EM | Admit: 2015-04-24 | Discharge: 2015-04-24 | Disposition: A | Discharge status: Home / Self Care | Payer: Managed Care, Other (non HMO) | Attending: Emergency Medicine | Admitting: Emergency Medicine

## 2015-04-24 DIAGNOSIS — I10 Essential (primary) hypertension: Secondary | ICD-10-CM | POA: Diagnosis not present

## 2015-04-24 DIAGNOSIS — Z79899 Other long term (current) drug therapy: Secondary | ICD-10-CM | POA: Diagnosis not present

## 2015-04-24 DIAGNOSIS — K0889 Other specified disorders of teeth and supporting structures: Secondary | ICD-10-CM | POA: Diagnosis not present

## 2015-04-24 DIAGNOSIS — K0381 Cracked tooth: Secondary | ICD-10-CM | POA: Insufficient documentation

## 2015-04-24 DIAGNOSIS — K029 Dental caries, unspecified: Secondary | ICD-10-CM | POA: Insufficient documentation

## 2015-04-24 DIAGNOSIS — Z8639 Personal history of other endocrine, nutritional and metabolic disease: Secondary | ICD-10-CM | POA: Diagnosis not present

## 2015-04-24 DIAGNOSIS — I4891 Unspecified atrial fibrillation: Secondary | ICD-10-CM | POA: Diagnosis not present

## 2015-04-24 DIAGNOSIS — F17200 Nicotine dependence, unspecified, uncomplicated: Secondary | ICD-10-CM | POA: Diagnosis not present

## 2015-04-24 MED ORDER — PENICILLIN V POTASSIUM 500 MG PO TABS: 500.0000 mg | ORAL_TABLET | Freq: Three times a day (TID) | ORAL | Status: AC

## 2015-04-24 MED ORDER — PENICILLIN V POTASSIUM 250 MG PO TABS
500.0000 mg | ORAL_TABLET | Freq: Once | ORAL | Status: AC
  Administered 2015-04-24: 500 mg via ORAL
  Filled 2015-04-24: qty 2

## 2015-04-24 NOTE — ED Notes (Signed)
Rx x 1 given for penicillin

## 2015-04-24 NOTE — ED Provider Notes (Signed)
CSN: [id number]     Arrival date & time 04/24/15  1201 History   First MD Initiated Contact with Patient 04/24/15 1232     Chief Complaint  Patient presents with  . Dental Problem     (Consider location/radiation/quality/duration/timing/severity/associated sxs/prior Treatment) The history is provided by the patient. No language interpreter was used.    Sarah Larson is a 50 year old female with a history of A. fib, hypertension and hypokalemia who presents with lower dental pain and possible abscess 2 days. Reports having this in the past. Cannot afford a dentist at this time. States she is only here for antibiotics and does not need pain control. She reports that she is not in much pain. Worse with eating. No treatment prior to arrival but she states that she was given penicillin past which helped. Denies any fever, chills, facial swelling.  Past Medical History  Diagnosis Date  . Hypertension   . Hypokalemia   . A-fib [facility](HCC)    Past Surgical History  Procedure Laterality Date  . Abdominal hysterectomy    . Appendectomy     No family history on file. Social History  Substance Use Topics  . Smoking status: Current Every Day Smoker  . Smokeless tobacco: None  . Alcohol Use: Yes     Comment: occasionally   OB History    No data available     Review of Systems  Constitutional: Negative for fever.  HENT: Positive for dental problem.       Allergies  Review of patient's allergies indicates no known allergies.  Home Medications   Prior to Admission medications   Medication Sig Start Date End Date Taking? Authorizing Provider  diltiazem (DILACOR XR) 240 MG 24 hr capsule Take 240 mg by mouth daily.    Historical Provider, MD  metoprolol succinate (TOPROL-XL) 25 MG 24 hr tablet Take 25 mg by mouth daily.    Historical Provider, MD  metoprolol tartrate (LOPRESSOR) 25 MG tablet Take 25 mg by mouth 2 (two) times daily.    Historical Provider, MD  moxifloxacin (VIGAMOX) 0.5  % ophthalmic solution Place 1 drop into the left eye 3 (three) times daily. For 7 days 08/26/13   Rolan BuccoMelanie Belfi, MD  penicillin v potassium (VEETID) 500 MG tablet Take 1 tablet (500 mg total) by mouth 3 (three) times daily. 04/24/15 05/01/15  Keison Glendinning Patel-Mills, PA-C   BP 170/111 mmHg  Pulse 87  Temp(Src) 98.8 F (37.1 C) (Oral)  Resp 18  Ht 5\' 3"  (1.6 m)  Wt 65.772 kg  BMI 25.69 kg/m2  SpO2 98% Physical Exam  Constitutional: She is oriented to person, place, and time. She appears well-developed and well-nourished.  HENT:  Mouth/Throat:    Multiple missing teeth and dental caries throughout. No palpable fluctuance or swelling of the face or gums. No anterior neck or submental swelling.  Eyes: Conjunctivae are normal.  Neck: Normal range of motion. Neck supple.  Cardiovascular: Normal rate.   Pulmonary/Chest: Effort normal. No respiratory distress.  Musculoskeletal: Normal range of motion.  Neurological: She is alert and oriented to person, place, and time.  Skin: Skin is warm and dry.  Nursing note and vitals reviewed.   ED Course  Procedures (including critical care time) Labs Review Labs Reviewed - No data to display  Imaging Review No results found.   EKG Interpretation None      MDM   Final diagnoses:  Pain, dental  Patient with dentalgia.  No abscess requiring immediate incision and drainage.  Exam not concerning for Ludwig's angina or pharyngeal abscess.  Will treat with pcn. Pt instructed to follow-up with dentist. Patient was seen in November 2016 for the same. Discussed return precautions. Pt safe for discharge.  Medications  penicillin v potassium (VEETID) tablet 500 mg (500 mg Oral Given 04/24/15 1253)   Filed Vitals:   04/24/15 1219  BP: 170/111  Pulse: 87  Temp: 98.8 F (37.1 C)  Resp: [address], PA-C 04/24/15 1619  Geoffery Lyons, MD 04/24/15 1620

## 2015-04-24 NOTE — ED Notes (Signed)
States she has a broken tooth and an abscess.

## 2015-05-21 ENCOUNTER — Encounter (HOSPITAL_BASED_OUTPATIENT_CLINIC_OR_DEPARTMENT_OTHER): Payer: Self-pay | Admitting: *Deleted

## 2015-05-21 ENCOUNTER — Emergency Department (HOSPITAL_BASED_OUTPATIENT_CLINIC_OR_DEPARTMENT_OTHER)
Admission: EM | Admit: 2015-05-21 | Discharge: 2015-05-21 | Disposition: A | Discharge status: Home / Self Care | Payer: Managed Care, Other (non HMO) | Attending: Emergency Medicine | Admitting: Emergency Medicine

## 2015-05-21 DIAGNOSIS — M273 Alveolitis of jaws: Secondary | ICD-10-CM | POA: Diagnosis not present

## 2015-05-21 DIAGNOSIS — Z8639 Personal history of other endocrine, nutritional and metabolic disease: Secondary | ICD-10-CM | POA: Insufficient documentation

## 2015-05-21 DIAGNOSIS — I10 Essential (primary) hypertension: Secondary | ICD-10-CM | POA: Insufficient documentation

## 2015-05-21 DIAGNOSIS — I4891 Unspecified atrial fibrillation: Secondary | ICD-10-CM | POA: Diagnosis not present

## 2015-05-21 DIAGNOSIS — F17210 Nicotine dependence, cigarettes, uncomplicated: Secondary | ICD-10-CM | POA: Diagnosis not present

## 2015-05-21 DIAGNOSIS — F17200 Nicotine dependence, unspecified, uncomplicated: Secondary | ICD-10-CM | POA: Diagnosis not present

## 2015-05-21 DIAGNOSIS — R51 Headache: Secondary | ICD-10-CM | POA: Insufficient documentation

## 2015-05-21 DIAGNOSIS — Z79899 Other long term (current) drug therapy: Secondary | ICD-10-CM | POA: Diagnosis not present

## 2015-05-21 DIAGNOSIS — K0889 Other specified disorders of teeth and supporting structures: Secondary | ICD-10-CM | POA: Diagnosis present

## 2015-05-21 MED ORDER — PENICILLIN V POTASSIUM 500 MG PO TABS: 500.0000 mg | ORAL_TABLET | Freq: Four times a day (QID) | ORAL | Status: AC

## 2015-05-21 MED FILL — PENICILLIN VK 500 MG TABLET: 500 | 10 days supply | Qty: 40 | Fill #0

## 2015-05-21 NOTE — Discharge Instructions (Signed)
Start antibiotic and take until all gone. Continue ibuprofen for pain. Continue salt water rinses. Follow up with your oral surgeon as soon as you're able to for recheck. Return if worsening

## 2015-05-21 NOTE — ED Notes (Signed)
Pt directed to pharmacy to pick up medications. Ambulatory with steady gait at time of discharge

## 2015-05-21 NOTE — ED Provider Notes (Signed)
CSN: [id number]     Arrival date & time 05/21/15  1232 History   First MD Initiated Contact with Patient 05/21/15 1259     Chief Complaint  Patient presents with  . Dental Pain     (Consider location/radiation/quality/duration/timing/severity/associated sxs/prior Treatment) HPI Sarah Larson is a 50 y.o. female with history of hypertension, A. fib, presents to emergency department complaining of dental pain. Patient states she had her left lower lateral incisor pulled out 4 days ago. States approximately 2 days developed increased pain in that area. She also reports swelling and believes it might be draining. She has taken ibuprofen for pain. No other treatment prior to coming in. She states she does self water rinses after eating. She was unable to go and see her dentist. She denies a fever or chills. No facial swelling. No other complaints.  Past Medical History  Diagnosis Date  . Hypertension   . Hypokalemia   . A-fib [facility])    Past Surgical History  Procedure Laterality Date  . Abdominal hysterectomy    . Appendectomy     No family history on file. Social History  Substance Use Topics  . Smoking status: Current Every Day Smoker  . Smokeless tobacco: None  . Alcohol Use: Yes     Comment: occasionally   OB History    No data available     Review of Systems  Constitutional: Negative for fever and chills.  HENT: Positive for dental problem. Negative for facial swelling and sore throat.   Neurological: Positive for headaches.  All other systems reviewed and are negative.     Allergies  Review of patient's allergies indicates no known allergies.  Home Medications   Prior to Admission medications   Medication Sig Start Date End Date Taking? Authorizing Provider  diltiazem (DILACOR XR) 240 MG 24 hr capsule Take 240 mg by mouth daily.    Historical Provider, MD  metoprolol succinate (TOPROL-XL) 25 MG 24 hr tablet Take 25 mg by mouth daily.    Historical Provider, MD   metoprolol tartrate (LOPRESSOR) 25 MG tablet Take 25 mg by mouth 2 (two) times daily.    Historical Provider, MD  moxifloxacin (VIGAMOX) 0.5 % ophthalmic solution Place 1 drop into the left eye 3 (three) times daily. For 7 days 08/26/13   Rolan Bucco, MD   BP 149/109 mmHg  Pulse 71  Temp(Src) 98.5 F (36.9 C) (Oral)  Resp 18  Ht  (1.6 m)  Wt 65.772 kg  BMI 25.69 kg/m2  SpO2 100% Physical Exam  Constitutional: She appears well-developed and well-nourished. No distress.  HENT:  Left lower lateral incisor is absent. There is mild swelling around the gum in that area. Clot is present in the socket. There is small purulent drainage around the socket. Tender to palpation  Eyes: Conjunctivae are normal.  Neck: Neck supple.  Neurological: She is alert.  Skin: Skin is warm and dry.  Nursing note and vitals reviewed.   ED Course  Procedures (including critical care time) Labs Review Labs Reviewed - No data to display  Imaging Review No results found. I have personally reviewed and evaluated these images and lab results as part of my medical decision-making.   EKG Interpretation None      MDM   Final diagnoses:  Infection of tooth socket   Patient with most likely infection postextraction. The clot is still present in the socket. There is however a small purulent drainage around the area. I will start her  on penicillin. She will need to follow-up closely with her oral surgeon for recheck whenever she is able to. I instructed her to continue salt water rinses. Also again encouraged to quit smoking. Patient voiced understanding. At this time she is afebrile, no facial swelling, no drainable abscess.  Filed Vitals:   05/21/15 1247 05/21/15 1303  BP: 154/106 149/109  Pulse: 77 71  Temp: 98.5 F (36.9 C)   TempSrc: Oral   Resp: 16 18  Height: 5\' 3"  (1.6 m)   Weight: 65.772 kg   SpO2: 100% 100%     Jaynie Crumbleatyana Amere Bricco, PA-C 05/21/15 1623  Gwyneth SproutWhitney Plunkett,  MD 05/21/15 1637

## 2015-05-21 NOTE — ED Notes (Signed)
States she had a tooth pulled 3 weeks ago. Her mouth is aching and swelling.

## 2015-10-04 ENCOUNTER — Encounter (HOSPITAL_BASED_OUTPATIENT_CLINIC_OR_DEPARTMENT_OTHER): Payer: Self-pay | Admitting: *Deleted

## 2015-10-04 ENCOUNTER — Emergency Department (HOSPITAL_BASED_OUTPATIENT_CLINIC_OR_DEPARTMENT_OTHER)
Admission: EM | Admit: 2015-10-04 | Discharge: 2015-10-04 | Disposition: A | Discharge status: Home / Self Care | Payer: Managed Care, Other (non HMO) | Attending: Emergency Medicine | Admitting: Emergency Medicine

## 2015-10-04 DIAGNOSIS — Z79899 Other long term (current) drug therapy: Secondary | ICD-10-CM | POA: Insufficient documentation

## 2015-10-04 DIAGNOSIS — I10 Essential (primary) hypertension: Secondary | ICD-10-CM | POA: Diagnosis not present

## 2015-10-04 DIAGNOSIS — F17200 Nicotine dependence, unspecified, uncomplicated: Secondary | ICD-10-CM | POA: Insufficient documentation

## 2015-10-04 DIAGNOSIS — K0889 Other specified disorders of teeth and supporting structures: Secondary | ICD-10-CM | POA: Diagnosis present

## 2015-10-04 DIAGNOSIS — H9209 Otalgia, unspecified ear: Secondary | ICD-10-CM | POA: Insufficient documentation

## 2015-10-04 MED ORDER — PENICILLIN V POTASSIUM 500 MG PO TABS: 500.0000 mg | ORAL_TABLET | Freq: Four times a day (QID) | ORAL | 0 refills | Status: AC

## 2015-10-04 MED ORDER — HYDROCODONE-ACETAMINOPHEN 5-325 MG PO TABS: 1.0000 | ORAL_TABLET | Freq: Four times a day (QID) | ORAL | 0 refills | Status: DC | PRN

## 2015-10-04 MED FILL — PENICILLIN VK 500 MG TABLET: 500 | 7 days supply | Qty: 28 | Fill #0

## 2015-10-04 MED FILL — HYDROCODON-APAP 5-325: 5-325 | 2 days supply | Qty: 12 | Fill #0

## 2015-10-04 NOTE — ED Provider Notes (Signed)
[facility]   CSN: [id number] Arrival date & time: 10/04/15  1146     History   Chief Complaint Chief Complaint  Patient presents with  . Dental Pain    HPI Sarah Larson is a 50 y.o. female with history of A. fib, hypertension and dental problems who presents with dental pain to her right lower molar. Patient states she has had comments with this tooth before, but has not had problems in the past few months. Patient could not afford to have it taken out a few months ago when she had another tooth extracted. Patient began having pain after eating a peanut butter and jelly sandwich last evening. She describes her pain as throbbing with radiation to her right ear and slight headache. Patient took Norco and penicillin that she had left over from a prior dental pain last evening and prior to arrival. Patient denies any fevers.  HPI  Past Medical History:  Diagnosis Date  . A-fib (HCC)   . Hypertension   . Hypokalemia     There are no active problems to display for this patient.   Past Surgical History:  Procedure Laterality Date  . ABDOMINAL HYSTERECTOMY    . APPENDECTOMY      OB History    No data available       Home Medications    Prior to Admission medications   Medication Sig Start Date End Date Taking? Authorizing Provider  metoprolol tartrate (LOPRESSOR) 25 MG tablet Take 25 mg by mouth 2 (two) times daily.   Yes Historical Provider, MD  diltiazem (DILACOR XR) 240 MG 24 hr capsule Take 240 mg by mouth daily.    Historical Provider, MD  HYDROcodone-acetaminophen (NORCO/VICODIN) 5-325 MG tablet Take 1-2 tablets by mouth every 6 (six) hours as needed. 10/04/15   Emi HolesAlexandra M Abdulahad Mederos, PA-C  metoprolol succinate (TOPROL-XL) 25 MG 24 hr tablet Take 25 mg by mouth daily.    Historical Provider, MD  moxifloxacin (VIGAMOX) 0.5 % ophthalmic solution Place 1 drop into the left eye 3 (three) times daily. For 7 days 08/26/13   Rolan BuccoMelanie Belfi, MD  penicillin  v potassium (VEETID) 500 MG tablet Take 1 tablet (500 mg total) by mouth 4 (four) times daily. 10/04/15 10/11/15  Emi HolesAlexandra M Dawnita Molner, PA-C    Family History No family history on file.  Social History Social History  Substance Use Topics  . Smoking status: Current Every Day Smoker  . Smokeless tobacco: Never Used  . Alcohol use Yes     Comment: occasionally     Allergies   Review of patient's allergies indicates no known allergies.   Review of Systems Review of Systems  Constitutional: Negative for fever.  HENT: Positive for dental problem and ear pain.   Musculoskeletal: Negative for neck pain.  Neurological: Positive for headaches (slight).     Physical Exam Updated Vital Signs BP (!) 168/102   Pulse 73   Temp 98.3 F (36.8 C) (Oral)   Resp 16   Ht 5\' 3"  (1.6 m)   Wt 63.5 kg   SpO2 100%   BMI 24.80 kg/m   Physical Exam  Constitutional: She appears well-developed and well-nourished. No distress.  HENT:  Head: Normocephalic and atraumatic.  Mouth/Throat: Oropharynx is clear and moist and mucous membranes are normal. She has dentures. Abnormal dentition. Dental caries present. No oropharyngeal exudate.    Poor dentition  Eyes: Conjunctivae are normal. Pupils are equal, round, and reactive to light. Right eye exhibits  no discharge. Left eye exhibits no discharge. No scleral icterus.  Neck: Normal range of motion. Neck supple. No thyromegaly present.  Cardiovascular: Normal rate, regular rhythm, normal heart sounds and intact distal pulses.  Exam reveals no gallop and no friction rub.   No murmur heard. Pulmonary/Chest: Effort normal and breath sounds normal. No stridor. No respiratory distress. She has no wheezes. She has no rales.  Abdominal: Soft. Bowel sounds are normal. She exhibits no distension. There is no tenderness. There is no rebound and no guarding.  Musculoskeletal: She exhibits no edema.  Lymphadenopathy:    She has no cervical adenopathy.    Neurological: She is alert. Coordination normal.  Skin: Skin is warm and dry. No rash noted. She is not diaphoretic. No pallor.  Psychiatric: She has a normal mood and affect.  Nursing note and vitals reviewed.    ED Treatments / Results  Labs (all labs ordered are listed, but only abnormal results are displayed) Labs Reviewed - No data to display  EKG  EKG Interpretation None       Radiology No results found.  Procedures Procedures (including critical care time)  Medications Ordered in ED Medications - No data to display   Initial Impression / Assessment and Plan / ED Course  I have reviewed the triage vital signs and the nursing notes.  Pertinent labs & imaging results that were available during my care of the patient were reviewed by me and considered in my medical decision making (see chart for details).  Clinical Course    Patient with dentalgia.  No abscess requiring immediate incision and drainage.  Exam not concerning for Ludwig's angina or pharyngeal abscess.  Will treat with Penicillin, Norco. Pt instructed to follow-up with dentist at her scheduled appointment next week.  Discussed return precautions. Patient understands and agrees with plan. Patient vitals stable throughout ED course and discharged in satisfactory condition.   Final Clinical Impressions(s) / ED Diagnoses   Final diagnoses:  Pain, dental    New Prescriptions New Prescriptions   HYDROCODONE-ACETAMINOPHEN (NORCO/VICODIN) 5-325 MG TABLET    Take 1-2 tablets by mouth every 6 (six) hours as needed.   PENICILLIN V POTASSIUM (VEETID) 500 MG TABLET    Take 1 tablet (500 mg total) by mouth 4 (four) times daily.     Emi Holes, PA-C 10/04/15 1214    Geoffery Lyons, MD 10/05/15 [phone]

## 2015-10-04 NOTE — Discharge Instructions (Signed)
Medications: Penicillin, Norco  Treatment: Take penicillin as prescribed for 7 days. Take 1-2 Norco every 4-6 hours as needed for your pain. Do not drive or operate machinery when taking Norco. You can take Tylenol every 4-6 hours, but do not take with Norco. You can also take ibuprofen or Aleve as prescribed over-the-counter. Use warm saltwater rinses 3-4 times daily.  Follow-up: Please follow-up with your dentist at your scheduled appointment next week or sooner if possible. Please return to the emergency department if you develop any new or worsening symptoms.

## 2015-10-04 NOTE — ED Triage Notes (Signed)
Toothache since last night

## 2016-01-17 ENCOUNTER — Encounter (HOSPITAL_BASED_OUTPATIENT_CLINIC_OR_DEPARTMENT_OTHER): Payer: Self-pay

## 2016-01-17 ENCOUNTER — Emergency Department (HOSPITAL_BASED_OUTPATIENT_CLINIC_OR_DEPARTMENT_OTHER)
Admission: EM | Admit: 2016-01-17 | Discharge: 2016-01-17 | Disposition: A | Discharge status: Home / Self Care | Payer: 59 | Attending: Emergency Medicine | Admitting: Emergency Medicine

## 2016-01-17 DIAGNOSIS — M545 Low back pain: Secondary | ICD-10-CM | POA: Diagnosis not present

## 2016-01-17 DIAGNOSIS — Y990 Civilian activity done for income or pay: Secondary | ICD-10-CM | POA: Diagnosis not present

## 2016-01-17 DIAGNOSIS — F17200 Nicotine dependence, unspecified, uncomplicated: Secondary | ICD-10-CM | POA: Diagnosis not present

## 2016-01-17 DIAGNOSIS — X500XXA Overexertion from strenuous movement or load, initial encounter: Secondary | ICD-10-CM | POA: Diagnosis not present

## 2016-01-17 DIAGNOSIS — Y9389 Activity, other specified: Secondary | ICD-10-CM | POA: Diagnosis not present

## 2016-01-17 DIAGNOSIS — S3992XA Unspecified injury of lower back, initial encounter: Secondary | ICD-10-CM | POA: Diagnosis present

## 2016-01-17 DIAGNOSIS — Y929 Unspecified place or not applicable: Secondary | ICD-10-CM | POA: Diagnosis not present

## 2016-01-17 DIAGNOSIS — I10 Essential (primary) hypertension: Secondary | ICD-10-CM | POA: Diagnosis not present

## 2016-01-17 DIAGNOSIS — M549 Dorsalgia, unspecified: Secondary | ICD-10-CM

## 2016-01-17 MED ORDER — PREDNISONE 50 MG PO TABS
60.0000 mg | ORAL_TABLET | Freq: Once | ORAL | Status: AC
  Administered 2016-01-17: 60 mg via ORAL
  Filled 2016-01-17: qty 1

## 2016-01-17 MED ORDER — LIDOCAINE 5 % EX PTCH: 1.0000 | MEDICATED_PATCH | CUTANEOUS | 0 refills | Status: DC

## 2016-01-17 MED ORDER — PREDNISONE 20 MG PO TABS: ORAL_TABLET | ORAL | 0 refills | Status: DC

## 2016-01-17 MED ORDER — OXYCODONE-ACETAMINOPHEN 5-325 MG PO TABS
1.0000 | ORAL_TABLET | Freq: Once | ORAL | Status: AC
  Administered 2016-01-17: 1 via ORAL
  Filled 2016-01-17: qty 1

## 2016-01-17 MED ORDER — OXYCODONE-ACETAMINOPHEN 5-325 MG PO TABS: 1.0000 | ORAL_TABLET | ORAL | 0 refills | Status: DC | PRN

## 2016-01-17 MED FILL — OXYCODONE/APAP 5-325: 5-325 | 2 days supply | Qty: 12 | Fill #0

## 2016-01-17 MED FILL — predniSONE 20 MG TABS: 20 | 9 days supply | Qty: 12 | Fill #0

## 2016-01-17 NOTE — ED Triage Notes (Signed)
C/o lower back x 6 days-denies injury-NAD-slow steady gait

## 2016-01-17 NOTE — Discharge Instructions (Signed)
Take the prescribed medication as directed.  Make sure to rest your back when possible.  Heat therapy may help (heating pad, warm shower, hot bath, etc) Follow-up with your primary care doctor. Return to the ED for new or worsening symptoms.

## 2016-01-17 NOTE — ED Provider Notes (Signed)
[facility]   CSN: [id number] Arrival date & time: 01/17/16  1124     History   Chief Complaint Chief Complaint  Patient presents with  . Back Pain    HPI Sarah Larson is a 50 y.o. female.  The history is provided by the patient and medical records.  Back Pain      50 year old female with history of A. fib, hypertension, hypokalemia, presenting to the ED for back pain. She does report a lot of heavy lifting, pushing, pulling, twisting, and over at work which she thinks has exacerbated her pain. Reports pain in the right lower back with radiation down the right leg. She denies any numbness or weakness of her legs. No bowel or bladder incontinence. She has taken Motrin at home without any significant relief in her pain. Denies any fever or chills. No history of back injuries or surgeries. No history of IV drug use or cancer.  Past Medical History:  Diagnosis Date  . A-fib (HCC)   . Hypertension   . Hypokalemia     There are no active problems to display for this patient.   Past Surgical History:  Procedure Laterality Date  . ABDOMINAL HYSTERECTOMY    . APPENDECTOMY      OB History    No data available       Home Medications    Prior to Admission medications   Not on File    Family History No family history on file.  Social History Social History  Substance Use Topics  . Smoking status: Current Every Day Smoker  . Smokeless tobacco: Never Used  . Alcohol use Yes     Comment: occasionally     Allergies   Patient has no known allergies.   Review of Systems Review of Systems  Musculoskeletal: Positive for back pain.  All other systems reviewed and are negative.    Physical Exam Updated Vital Signs BP 149/96 (BP Location: Right Arm)   Pulse 69   Temp 98.3 F (36.8 C) (Oral)   Resp 16   Ht 5\' 3"  (1.6 m)   Wt 64.4 kg   SpO2 100%   BMI 25.15 kg/m   Physical Exam  Constitutional: She is oriented to person,  place, and time. She appears well-developed and well-nourished.  HENT:  Head: Normocephalic and atraumatic.  Mouth/Throat: Oropharynx is clear and moist.  Eyes: Conjunctivae and EOM are normal. Pupils are equal, round, and reactive to light.  Neck: Normal range of motion.  Cardiovascular: Normal rate, regular rhythm and normal heart sounds.   Pulmonary/Chest: Effort normal and breath sounds normal. No respiratory distress. She has no wheezes.  Abdominal: Soft. Bowel sounds are normal. There is no tenderness. There is no rebound.  Musculoskeletal: Normal range of motion.  Right lumbar spine and SI joint tender to palpation, no bony deformity or step-off, normal strength and sensation of both legs, positive straight leg raise on right, negative on left, ambulatory with steady gait in exam room  Neurological: She is alert and oriented to person, place, and time.  Skin: Skin is warm and dry.  Psychiatric: She has a normal mood and affect.  Nursing note and vitals reviewed.    ED Treatments / Results  Labs (all labs ordered are listed, but only abnormal results are displayed) Labs Reviewed - No data to display  EKG  EKG Interpretation None       Radiology No results found.  Procedures Procedures (including critical care time)  Medications Ordered in ED Medications  predniSONE (DELTASONE) tablet 60 mg (not administered)  oxyCODONE-acetaminophen (PERCOCET/ROXICET) 5-325 MG per tablet 1 tablet (not administered)     Initial Impression / Assessment and Plan / ED Course  I have reviewed the triage vital signs and the nursing notes.  Pertinent labs & imaging results that were available during my care of the patient were reviewed by me and considered in my medical decision making (see chart for details).  Clinical Course    50 year old female here with atraumatic back pain. Does report frequent change of position in heavy lifting at work. She is afebrile and nontoxic. She has no  focal neurologic deficits were red flag symptoms on exam today. Feel her symptoms are likely secondary to lumbar radiculopathy/sciatica. Will treat symptomatically.  Encouraged rest, heat therapy to back for added relief.  Follow-up with PCP.  Discussed plan with patient, she acknowledged understanding and agreed with plan of care.  Return precautions given for new or worsening symptoms.  Final Clinical Impressions(s) / ED Diagnoses   Final diagnoses:  Acute right-sided back pain, unspecified back location    New Prescriptions New Prescriptions   LIDOCAINE (LIDODERM) 5 %    Place 1 patch onto the skin daily. Remove & Discard patch within 12 hours or as directed by MD   OXYCODONE-ACETAMINOPHEN (PERCOCET/ROXICET) 5-325 MG TABLET    Take 1 tablet by mouth every 4 (four) hours as needed.   PREDNISONE (DELTASONE) 20 MG TABLET    Take 40 mg by mouth daily for 3 days, then 20mg  by mouth daily for 3 days, then 10mg  daily for 3 days     Garlon HatchetLisa M Orlo Brickle, PA-C 01/17/16 1321    Benjiman CoreNathan Pickering, MD 01/18/16 1534

## 2016-02-14 ENCOUNTER — Encounter (HOSPITAL_BASED_OUTPATIENT_CLINIC_OR_DEPARTMENT_OTHER): Payer: Self-pay

## 2016-02-14 ENCOUNTER — Emergency Department (HOSPITAL_BASED_OUTPATIENT_CLINIC_OR_DEPARTMENT_OTHER)
Admission: EM | Admit: 2016-02-14 | Discharge: 2016-02-14 | Disposition: A | Discharge status: Home / Self Care | Payer: 59 | Attending: Emergency Medicine | Admitting: Emergency Medicine

## 2016-02-14 DIAGNOSIS — F17200 Nicotine dependence, unspecified, uncomplicated: Secondary | ICD-10-CM | POA: Insufficient documentation

## 2016-02-14 DIAGNOSIS — K047 Periapical abscess without sinus: Secondary | ICD-10-CM | POA: Insufficient documentation

## 2016-02-14 DIAGNOSIS — Z720 Tobacco use: Secondary | ICD-10-CM

## 2016-02-14 DIAGNOSIS — K029 Dental caries, unspecified: Secondary | ICD-10-CM | POA: Diagnosis not present

## 2016-02-14 DIAGNOSIS — Z79899 Other long term (current) drug therapy: Secondary | ICD-10-CM | POA: Diagnosis not present

## 2016-02-14 DIAGNOSIS — I10 Essential (primary) hypertension: Secondary | ICD-10-CM | POA: Diagnosis not present

## 2016-02-14 DIAGNOSIS — K0889 Other specified disorders of teeth and supporting structures: Secondary | ICD-10-CM | POA: Diagnosis present

## 2016-02-14 MED ORDER — DOXYCYCLINE HYCLATE 100 MG PO CAPS: 100.0000 mg | ORAL_CAPSULE | Freq: Two times a day (BID) | ORAL | 0 refills | Status: DC

## 2016-02-14 NOTE — ED Provider Notes (Signed)
[facility] Note   CSN: [id number] Arrival date & time: 02/14/16  2106   By signing my name below, I, Clarisse Gouge, attest that this documentation has been prepared under the direction and in the presence of [employer]. Electronically Signed: Clarisse Gouge, Scribe. 02/14/16. 11:08 PM.   History   Chief Complaint Chief Complaint  Patient presents with  . Dental Pain   The history is provided by the patient and medical records. No language interpreter was used.  Dental Pain   This is a recurrent problem. The current episode started yesterday. The problem occurs constantly. The problem has been gradually worsening. The pain is at a severity of 10/10. The pain is severe. Treatments tried: ibuprofen and oragel, coconut oil, salt water swishes. The treatment provided mild relief.    HPI Comments: Sarah Larson is a 51 y.o. female with a PMHx of HTN, Afib on metoprolol, and recurrent dental issues, who presents to the Emergency Department complaining of a believed dental abscess x 2 days. She reports that she's had issues with her teeth and needs a dental procedure done but her insurance won't cover it for 1 year. Has a dentist but hasn't seen them in several months. Describes the pain as 10/10 constant, sore/achy/dull R lower dental pain radiating to the R ear, which is worse with eating. Notes that she has taken leftover amoxicillin from an rx from her dentist from 2-3 months ago, as well as ibuprofen and orajel with mild temporary relief. Also notes use of coconut oil and salt water mouthwases with mild relief. Associated gum swelling/abscess and facial swelling near the area in question. Pt denies ear drainage, gum drainage, trismus, drooling, rhinorrhea, sore throat, fever/chills, difficulty swallowing, CP, SOB, abd pain, n/v/d/c, dysuria, hematuria, numbness, tingling, focal weakness or any other complaints at this time. Notes she smokes tobacco but is trying  to stop. NKDA. Not on blood thinners for her Afib.   Past Medical History:  Diagnosis Date  . A-fib (HCC)   . Hypertension   . Hypokalemia     There are no active problems to display for this patient.   Past Surgical History:  Procedure Laterality Date  . ABDOMINAL HYSTERECTOMY    . APPENDECTOMY      OB History    No data available       Home Medications    Prior to Admission medications   Medication Sig Start Date End Date Taking? Authorizing Provider  METOPROLOL TARTRATE PO Take by mouth.   Yes Historical Provider, MD    Family History No family history on file.  Social History Social History  Substance Use Topics  . Smoking status: Current Every Day Smoker  . Smokeless tobacco: Never Used  . Alcohol use Yes     Comment: occasionally     Allergies   Patient has no known allergies.   Review of Systems Review of Systems  Constitutional: Negative for chills and fever.  HENT: Positive for dental problem, ear pain and facial swelling. Negative for drooling, ear discharge, rhinorrhea, sore throat and trouble swallowing.   Respiratory: Negative for shortness of breath.   Cardiovascular: Negative for chest pain.  Gastrointestinal: Negative for abdominal pain, constipation, diarrhea, nausea and vomiting.  Genitourinary: Negative for dysuria and hematuria.  Musculoskeletal: Negative for arthralgias and myalgias.  Skin: Negative for color change.  Allergic/Immunologic: Negative for immunocompromised state.  Neurological: Negative for weakness and numbness.  Hematological: Does not bruise/bleed easily.  Psychiatric/Behavioral: Negative for confusion.  A complete 10 system review of systems was obtained and all systems are negative except as noted in the HPI and PMH.    Physical Exam Updated Vital Signs BP 147/100 (BP Location: Left Arm)   Pulse 86   Temp 98.1 F (36.7 C) (Oral)   Resp 18   SpO2 100%   Physical Exam  Constitutional: She is oriented to  person, place, and time. Vital signs are normal. She appears well-developed and well-nourished.  Non-toxic appearance. No distress.  Afebrile, nontoxic, NAD  HENT:  Head: Normocephalic and atraumatic.  Right Ear: Hearing, tympanic membrane, external ear and ear canal normal.  Left Ear: Hearing, tympanic membrane, external ear and ear canal normal.  Nose: Nose normal.  Mouth/Throat: Uvula is midline, oropharynx is clear and moist and mucous membranes are normal. No trismus in the jaw. Dental abscesses and dental caries present. No uvula swelling. Tonsils are 0 on the right. Tonsils are 0 on the left. No tonsillar exudate.  Poor dentition diffusely with multiple missing teeth, with mild TTP to right lower molar number 31 with mild surrounding gingival swelling and erythema, indurated abscess adjacent to this area without any fluctuance or drainage. No evidence of Ludwig's. Ears are clear bilaterally. Nose clear. Oropharynx clear and moist, without uvular swelling or deviation, no trismus or drooling, no tonsillar swelling or erythema, no exudates.    Eyes: Conjunctivae and EOM are normal. Right eye exhibits no discharge. Left eye exhibits no discharge.  Neck: Normal range of motion. Neck supple.  Cardiovascular: Normal rate and intact distal pulses.   Pulmonary/Chest: Effort normal. No respiratory distress.  Abdominal: Normal appearance. She exhibits no distension.  Musculoskeletal: Normal range of motion.  Neurological: She is alert and oriented to person, place, and time. She has normal strength. No sensory deficit.  Skin: Skin is warm, dry and intact. No rash noted.  Psychiatric: She has a normal mood and affect. Her behavior is normal.  Nursing note and vitals reviewed.    ED Treatments / Results  DIAGNOSTIC STUDIES: Oxygen Saturation is 100% on RA, low by my interpretation.    COORDINATION OF CARE: 11:09 PM Discussed treatment plan with pt at bedside and pt agreed to plan.  Labs (all  labs ordered are listed, but only abnormal results are displayed) Labs Reviewed - No data to display  EKG  EKG Interpretation None       Radiology No results found.  Procedures Procedures (including critical care time)  Medications Ordered in ED Medications - No data to display   Initial Impression / Assessment and Plan / ED Course  I have reviewed the triage vital signs and the nursing notes.  Pertinent labs & imaging results that were available during my care of the patient were reviewed by me and considered in my medical decision making (see chart for details).     51 y.o. female here with Dental pain associated with dental caries and indurated dental abscess with patient afebrile, non toxic appearing and swallowing secretions well, no evidence of ludwig's. No fluctuance to abscess, doubt it would be amendable to I&D at this time.  I stressed the importance of dental follow up for ultimate management of dental pain.  I have also discussed reasons to return immediately to the ER.  Patient expresses understanding and agrees with plan. Also stressed importance of never having left over abx from prior prescriptions. Discussed use of tylenol/motrin/heat/oragel. I will also give doxycycline. Smoking cessation advised.   I personally performed the services  described in this documentation, which was scribed in my presence. The recorded information has been reviewed and is accurate.   Final Clinical Impressions(s) / ED Diagnoses   Final diagnoses:  Pain due to dental caries  Dental abscess  Tobacco user    New Prescriptions New Prescriptions   DOXYCYCLINE (VIBRAMYCIN) 100 MG CAPSULE    Take 1 capsule (100 mg total) by mouth 2 (two) times daily. One po bid x 7 days     [address] Orelia Brandstetter, PA-C 02/14/16 2339    Paula Libra, MD 02/15/16 [phone]

## 2016-02-14 NOTE — ED Triage Notes (Signed)
Right lower toothache x 2 days-NAD-steady gait

## 2016-02-14 NOTE — ED Notes (Signed)
ED Provider at bedside. 

## 2016-02-14 NOTE — Discharge Instructions (Signed)
Apply warm compresses to jaw throughout the day. Take antibiotic UNTIL FINISHED! DO NOT HAVE LEFT OVER UNFINISHED ANTIBIOTICS, EVER! Alternate between tylenol and  motrin as needed for pain. Perform salt water swishes to help with pain/swelling. Use over the counter oragel as needed for additional relief. Followup with a dentist is very important for ongoing evaluation and management of recurrent dental pain, call your dentist tomorrow to schedule an appointment ASAP for ongoing management of your dental abscess. STOP SMOKING! Return to emergency department for emergent changing or worsening symptoms.

## 2016-10-06 ENCOUNTER — Encounter (HOSPITAL_BASED_OUTPATIENT_CLINIC_OR_DEPARTMENT_OTHER): Payer: Self-pay

## 2016-10-06 ENCOUNTER — Emergency Department (HOSPITAL_BASED_OUTPATIENT_CLINIC_OR_DEPARTMENT_OTHER)
Admission: EM | Admit: 2016-10-06 | Discharge: 2016-10-06 | Disposition: A | Discharge status: Home / Self Care | Payer: 59 | Attending: Emergency Medicine | Admitting: Emergency Medicine

## 2016-10-06 DIAGNOSIS — I10 Essential (primary) hypertension: Secondary | ICD-10-CM | POA: Diagnosis not present

## 2016-10-06 DIAGNOSIS — F17200 Nicotine dependence, unspecified, uncomplicated: Secondary | ICD-10-CM | POA: Insufficient documentation

## 2016-10-06 DIAGNOSIS — Z79899 Other long term (current) drug therapy: Secondary | ICD-10-CM | POA: Diagnosis not present

## 2016-10-06 DIAGNOSIS — R200 Anesthesia of skin: Secondary | ICD-10-CM | POA: Diagnosis present

## 2016-10-06 DIAGNOSIS — R202 Paresthesia of skin: Secondary | ICD-10-CM

## 2016-10-06 LAB — CBC
HCT: 39.7 % (ref 36.0–46.0)
Hemoglobin: 13.9 g/dL (ref 12.0–15.0)
MCH: 31.4 pg (ref 26.0–34.0)
MCHC: 35 g/dL (ref 30.0–36.0)
MCV: 89.6 fL (ref 78.0–100.0)
PLATELETS: 232 10*3/uL (ref 150–400)
RBC: 4.43 MIL/uL (ref 3.87–5.11)
RDW: 13.5 % (ref 11.5–15.5)
WBC: 9.1 10*3/uL (ref 4.0–10.5)

## 2016-10-06 LAB — BASIC METABOLIC PANEL
Anion gap: 8 (ref 5–15)
BUN: 11 mg/dL (ref 6–20)
CALCIUM: 9.3 mg/dL (ref 8.9–10.3)
CO2: 25 mmol/L (ref 22–32)
Chloride: 108 mmol/L (ref 101–111)
Creatinine, Ser: 0.77 mg/dL (ref 0.44–1.00)
GFR calc non Af Amer: >60 mL/min (ref >60)
Glucose, Bld: 106 mg/dL — ABNORMAL HIGH (ref 65–99)
Potassium: 3.4 mmol/L — ABNORMAL LOW (ref 3.5–5.1)
Sodium: 141 mmol/L (ref 135–145)

## 2016-10-06 NOTE — ED Triage Notes (Signed)
Pt reports recent switch of BP medication. Sts she was taking metoprolol and lisinopril. Recent change to norvasc. Pt also c/o tingling in left arm x 3 months.

## 2016-10-06 NOTE — ED Notes (Signed)
ED Provider at bedside. 

## 2016-10-06 NOTE — ED Notes (Signed)
Pt concerned about wait time. Informed pt that blood results are back and EKG was shown to the MD.

## 2016-10-06 NOTE — Discharge Instructions (Signed)
I would recommend you follow up if your symptoms worsen. All of your labs are within normal limits

## 2016-10-06 NOTE — ED Provider Notes (Signed)
[facility]   CSN: [id number] Arrival date & time: 10/06/16  1126     History   Chief Complaint Chief Complaint  Patient presents with  . Arm Problem  . Hypertension    HPI Sarah Larson is a 51 y.o. female.  HPI   Patient with pmhx HTN, Afib presenting with numbness and tingling in right hand. She has had numbness and tingling in her arm for about the last 3 months. She denies any other symptoms associated with this.  Patient states she has numbness and tingling occurs intermittently with no specific association- lasting less than 30 secs. Patient is followed by cardiology for her A. fib. Patient denies any weakness in her arm. Patient denies any pain. Patient smokes ciagrettes for over 30 years. She thought that this was due to her blood pressure medication was recently changed her blood pressure medication from lisinopril to Norvasc per conversation with her cardiologist. However she feels like this has made a significant change in how she feels. Denies any fevers, congestion, chest pain, abdominal pain.  Past Medical History:  Diagnosis Date  . A-fib (HCC)   . Hypertension   . Hypokalemia     There are no active problems to display for this patient.   Past Surgical History:  Procedure Laterality Date  . ABDOMINAL HYSTERECTOMY    . APPENDECTOMY      OB History    No data available       Home Medications    Prior to Admission medications   Medication Sig Start Date End Date Taking? Authorizing Provider  amLODipine (NORVASC) 5 MG tablet Take 5 mg by mouth daily.   Yes [provider]  doxycycline (VIBRAMYCIN) 100 MG capsule Take 1 capsule (100 mg total) by mouth 2 (two) times daily. One po bid x 7 days 02/14/16   Street, [place], PA-C  METOPROLOL TARTRATE PO Take by mouth.    [provider]    Family History No family history on file.  Social History Social History  Substance Use Topics  . Smoking status:  Current Every Day Smoker  . Smokeless tobacco: Never Used  . Alcohol use Yes     Comment: occasionally     Allergies   Patient has no known allergies.   Review of Systems Review of Systems  Constitutional: Negative for chills and fever.  HENT: Negative for congestion.   Respiratory: Negative for cough and shortness of breath.   Cardiovascular: Negative for chest pain and leg swelling.  Gastrointestinal: Negative for nausea and vomiting.     Physical Exam Updated Vital Signs BP (!) 150/100 (BP Location: Right Arm)   Pulse 85   Temp 98.5 F (36.9 C) (Oral)   Resp 16   SpO2 100%   Physical Exam  Constitutional: She is oriented to person, place, and time. She appears well-developed and well-nourished.  HENT:  Head: Normocephalic and atraumatic.  Right Ear: External ear normal.  Left Ear: External ear normal.  Eyes: Pupils are equal, round, and reactive to light. Conjunctivae are normal.  Neck: Normal range of motion. Neck supple.  Cardiovascular: Normal rate, regular rhythm, normal heart sounds and intact distal pulses.   Pulmonary/Chest: Effort normal and breath sounds normal.  Abdominal: Soft. Bowel sounds are normal.  Musculoskeletal: Normal range of motion.  Neurological: She is alert and oriented to person, place, and time.  Skin: Skin is warm. Capillary refill takes less than 2 seconds.     ED Treatments /  Results  Labs (all labs ordered are listed, but only abnormal results are displayed) Labs Reviewed  BASIC METABOLIC PANEL  CBC    EKG  EKG Interpretation None       Radiology No results found.  Procedures Procedures (including critical care time)  Medications Ordered in ED Medications - No data to display   Initial Impression / Assessment and Plan / ED Course  I have reviewed the triage vital signs and the nursing notes.  Pertinent labs & imaging results that were available during my care of the patient were reviewed by me and considered  in my medical decision making (see chart for details).   Patient presenting with numbness and tingling in her left arm last for about 30 seconds. Electrolytes and anemia were considered with negative labs. Likely musculoskeletal in nature, will provide referral to sports medicine if no improvement by next week. Normal EKG, no other symptoms associated with her disease on examination or symptomatically    Final Clinical Impressions(s) / ED Diagnoses   Final diagnoses:  None    New Prescriptions New Prescriptions   No medications on file     Berton BonMikell, Alzena Gerber Zahra, MD 10/06/16 1337    Gwyneth SproutPlunkett, Whitney, MD 10/06/16 [phone]

## 2016-10-11 ENCOUNTER — Emergency Department (HOSPITAL_BASED_OUTPATIENT_CLINIC_OR_DEPARTMENT_OTHER)
Admission: EM | Admit: 2016-10-11 | Discharge: 2016-10-11 | Disposition: A | Discharge status: Home / Self Care | Payer: 59 | Attending: Emergency Medicine | Admitting: Emergency Medicine

## 2016-10-11 ENCOUNTER — Encounter (HOSPITAL_BASED_OUTPATIENT_CLINIC_OR_DEPARTMENT_OTHER): Payer: Self-pay | Admitting: Emergency Medicine

## 2016-10-11 ENCOUNTER — Emergency Department (HOSPITAL_BASED_OUTPATIENT_CLINIC_OR_DEPARTMENT_OTHER): Payer: 59

## 2016-10-11 DIAGNOSIS — F17200 Nicotine dependence, unspecified, uncomplicated: Secondary | ICD-10-CM | POA: Diagnosis not present

## 2016-10-11 DIAGNOSIS — R51 Headache: Secondary | ICD-10-CM | POA: Diagnosis present

## 2016-10-11 DIAGNOSIS — R519 Headache, unspecified: Secondary | ICD-10-CM

## 2016-10-11 DIAGNOSIS — Z79899 Other long term (current) drug therapy: Secondary | ICD-10-CM | POA: Insufficient documentation

## 2016-10-11 DIAGNOSIS — I10 Essential (primary) hypertension: Secondary | ICD-10-CM | POA: Diagnosis not present

## 2016-10-11 MED ORDER — PROCHLORPERAZINE MALEATE 10 MG PO TABS
10.0000 mg | ORAL_TABLET | Freq: Once | ORAL | Status: AC
  Administered 2016-10-11: 10 mg via ORAL
  Filled 2016-10-11: qty 1

## 2016-10-11 MED ORDER — ACETAMINOPHEN 500 MG PO TABS
1000.0000 mg | ORAL_TABLET | Freq: Once | ORAL | Status: AC
  Administered 2016-10-11: 1000 mg via ORAL
  Filled 2016-10-11: qty 2

## 2016-10-11 NOTE — ED Provider Notes (Signed)
[facility]   CSN: [id number] Arrival date & time: 10/11/16  1827     History   Chief Complaint Chief Complaint  Patient presents with  . Headache    HPI Sarah Larson is a 51 y.o. female.  HPI   51 year old female presents with concern for sharp pains to the left side of her head. Reports it feels stabbing pain to the left side of her head and the back of her head. Reports it started yesterday slowly, and has been coming and going. Denies nausea, vomiting or photophobia. Reports is not worse with loud sounds.  She was seen on Monday for tingling in her hand, without other neurologic symptoms.  Has had headaches like this but it has been a long time and have never been this bad.   Past Medical History:  Diagnosis Date  . A-fib (HCC)   . Hypertension   . Hypokalemia     There are no active problems to display for this patient.   Past Surgical History:  Procedure Laterality Date  . ABDOMINAL HYSTERECTOMY    . APPENDECTOMY      OB History    No data available       Home Medications    Prior to Admission medications   Medication Sig Start Date End Date Taking? Authorizing Provider  amLODipine (NORVASC) 5 MG tablet Take 5 mg by mouth daily.   Yes [provider]  METOPROLOL TARTRATE PO Take by mouth.   Yes [provider]  doxycycline (VIBRAMYCIN) 100 MG capsule Take 1 capsule (100 mg total) by mouth 2 (two) times daily. One po bid x 7 days 02/14/16   Street, [place], PA-C    Family History No family history on file.  Social History Social History  Substance Use Topics  . Smoking status: Current Every Day Smoker  . Smokeless tobacco: Never Used  . Alcohol use Yes     Comment: occasionally     Allergies   Patient has no known allergies.   Review of Systems Review of Systems  Constitutional: Negative for fever.  HENT: Negative for sore throat.   Eyes: Negative for visual disturbance.  Respiratory:  Negative for cough and shortness of breath.   Cardiovascular: Negative for chest pain.  Gastrointestinal: Negative for abdominal pain and nausea.  Genitourinary: Negative for difficulty urinating.  Musculoskeletal: Negative for back pain and neck pain.  Skin: Negative for rash.  Neurological: Positive for numbness (tingling to left fingers) and headaches. Negative for syncope, facial asymmetry, speech difficulty and weakness.     Physical Exam Updated Vital Signs BP (!) 164/105 (BP Location: Right Arm)   Pulse 73   Temp 98.4 F (36.9 C) (Oral)   Resp 16   Wt 64.4 kg (142 lb)   SpO2 100%   BMI 25.15 kg/m   Physical Exam  Constitutional: She is oriented to person, place, and time. She appears well-developed and well-nourished. No distress.  HENT:  Head: Normocephalic and atraumatic.  Eyes: Conjunctivae and EOM are normal.  Neck: Normal range of motion.  Cardiovascular: Normal rate, regular rhythm, normal heart sounds and intact distal pulses.  Exam reveals no gallop and no friction rub.   No murmur heard. Pulmonary/Chest: Effort normal and breath sounds normal. No respiratory distress. She has no wheezes. She has no rales.  Abdominal: Soft. She exhibits no distension. There is no tenderness. There is no guarding.  Musculoskeletal: She exhibits no edema or tenderness.  Neurological: She is alert  and oriented to person, place, and time. She has normal strength. No cranial nerve deficit or sensory deficit (normal sensation including in left hand). Coordination and gait normal. GCS eye subscore is 4. GCS verbal subscore is 5. GCS motor subscore is 6.  Skin: Skin is warm and dry. No rash noted. She is not diaphoretic. No erythema.  Nursing note and vitals reviewed.    ED Treatments / Results  Labs (all labs ordered are listed, but only abnormal results are displayed) Labs Reviewed - No data to display  EKG  EKG Interpretation None       Radiology Ct Head Wo  Contrast  Result Date: 10/11/2016 CLINICAL DATA:  Thunderclap headache EXAM: CT HEAD WITHOUT CONTRAST TECHNIQUE: Contiguous axial images were obtained from the base of the skull through the vertex without intravenous contrast. COMPARISON:  None. FINDINGS: Brain: No mass lesion, intraparenchymal hemorrhage or extra-axial collection. No evidence of acute cortical infarct. Brain parenchyma and CSF-containing spaces are normal for age. Vascular: No hyperdense vessel or unexpected calcification. Skull: Normal visualized skull base, calvarium and extracranial soft tissues. Sinuses/Orbits: No sinus fluid levels or advanced mucosal thickening. No mastoid effusion. Normal orbits. IMPRESSION: Normal head CT for age. Electronically Signed   By: Deatra Robinson M.D.   On: 10/11/2016 20:56    Procedures Procedures (including critical care time)  Medications Ordered in ED Medications  prochlorperazine (COMPAZINE) tablet 10 mg (10 mg Oral Given 10/11/16 2005)  acetaminophen (TYLENOL) tablet 1,000 mg (1,000 mg Oral Given 10/11/16 2005)     Initial Impression / Assessment and Plan / ED Course  I have reviewed the triage vital signs and the nursing notes.  Pertinent labs & imaging results that were available during my care of the patient were reviewed by me and considered in my medical decision making (see chart for details).     51 year old female with history of hypertension, atrial fibrillation presents with concern for headache. CT head done given severity described by patient and shows no acute findings. Slow onset of headache, and have low suspicion for subarachnoid hemorrhage. She was given Compazine with resolution of pain.  Recommend PCP follow up. Patient discharged in stable condition with understanding of reasons to return.    Final Clinical Impressions(s) / ED Diagnoses   Final diagnoses:  Acute nonintractable headache, unspecified headache type    New Prescriptions Discharge Medication List  as of 10/11/2016  9:21 PM       Alvira Monday, MD 10/13/16 1191

## 2016-10-11 NOTE — ED Triage Notes (Signed)
Stabbing pain to the back of her head since last night. Also reports tingling in fingers of L hand. Was seen here Monday for the tingling.

## 2016-10-11 NOTE — ED Notes (Addendum)
States took a extra of Norvasc while in waiting room . Having "sharp pains" to left side of head since yesterday that comes and goes. Denies pain at present. Asking for snack and soda. Given crackers and gingerale. Denies n/v or photophobia

## 2016-10-11 NOTE — ED Notes (Signed)
Provided strict f/u instructions for PCP about uncontrolled HTN.

## 2017-03-16 ENCOUNTER — Encounter (HOSPITAL_BASED_OUTPATIENT_CLINIC_OR_DEPARTMENT_OTHER): Payer: Self-pay | Admitting: Emergency Medicine

## 2017-03-16 ENCOUNTER — Other Ambulatory Visit: Payer: Self-pay

## 2017-03-16 ENCOUNTER — Emergency Department (HOSPITAL_BASED_OUTPATIENT_CLINIC_OR_DEPARTMENT_OTHER)
Admission: EM | Admit: 2017-03-16 | Discharge: 2017-03-16 | Disposition: A | Discharge status: Home / Self Care | Payer: 59 | Attending: Physician Assistant | Admitting: Physician Assistant

## 2017-03-16 DIAGNOSIS — F17210 Nicotine dependence, cigarettes, uncomplicated: Secondary | ICD-10-CM | POA: Insufficient documentation

## 2017-03-16 DIAGNOSIS — K0889 Other specified disorders of teeth and supporting structures: Secondary | ICD-10-CM | POA: Diagnosis present

## 2017-03-16 DIAGNOSIS — Z79899 Other long term (current) drug therapy: Secondary | ICD-10-CM | POA: Diagnosis not present

## 2017-03-16 HISTORY — DX: Gastro-esophageal reflux disease without esophagitis: K21.9

## 2017-03-16 MED ORDER — ACETAMINOPHEN 500 MG PO TABS
1000.0000 mg | ORAL_TABLET | Freq: Once | ORAL | Status: AC
  Administered 2017-03-16: 1000 mg via ORAL
  Filled 2017-03-16: qty 2

## 2017-03-16 MED ORDER — PENICILLIN V POTASSIUM 250 MG PO TABS
500.0000 mg | ORAL_TABLET | Freq: Once | ORAL | Status: AC
  Administered 2017-03-16: 500 mg via ORAL
  Filled 2017-03-16: qty 2

## 2017-03-16 MED ORDER — PENICILLIN V POTASSIUM 500 MG PO TABS: 500.0000 mg | ORAL_TABLET | Freq: Four times a day (QID) | ORAL | 0 refills | Status: AC

## 2017-03-16 NOTE — ED Provider Notes (Signed)
[facility] EMERGENCY DEPARTMENT Provider Note   CSN: [id number] Arrival date & time: 03/16/17  0944     History   Chief Complaint Chief Complaint  Patient presents with  . Dental Pain    HPI Sarah Larson is a 52 y.o. female.  HPI   Patient is a 52 year old female with a history of irregular heartbeat (not an any anticoagulation), GERD, hypertension presenting for right lower dental pain.  Patient reports she has an upcoming appointment with a dentist in 3-4 days.  Patient reports that she has had problems with this tooth in the past, and reports that he has broken off.  Patient reports that she has painful chewing and swallowing on that side, but denies any obstructive swallowing, difficulty breathing, submandibular tenderness, lingual swelling, or neck induration.  Patient denies fever or chills.  Patient has been taking ibuprofen and Tylenol for the pain without relief.  Patient has also tried placing Orajel over the site.  Past Medical History:  Diagnosis Date  . A-fib (HCC)   . GERD (gastroesophageal reflux disease)   . Hypertension   . Hypokalemia     There are no active problems to display for this patient.   Past Surgical History:  Procedure Laterality Date  . ABDOMINAL HYSTERECTOMY    . APPENDECTOMY      OB History    No data available       Home Medications    Prior to Admission medications   Medication Sig Start Date End Date Taking? Authorizing Provider  omeprazole (PRILOSEC) 40 MG capsule Take 40 mg by mouth daily.   Yes [provider]  amLODipine (NORVASC) 5 MG tablet Take 5 mg by mouth daily.    [provider]  doxycycline (VIBRAMYCIN) 100 MG capsule Take 1 capsule (100 mg total) by mouth 2 (two) times daily. One po bid x 7 days 02/14/16   Street, [place], PA-C  METOPROLOL TARTRATE PO Take by mouth.    [provider]  penicillin v potassium (VEETID) 500 MG tablet Take 1 tablet (500 mg total) by mouth 4  (four) times daily for 7 days. 03/16/17 03/23/17  Elisha PonderMurray, Izella Ybanez B, PA-C    Family History No family history on file.  Social History Social History   Tobacco Use  . Smoking status: Current Every Day Smoker    Types: Cigarettes  . Smokeless tobacco: Never Used  Substance Use Topics  . Alcohol use: Yes    Comment: occasionally  . Drug use: No     Allergies   Patient has no known allergies.   Review of Systems Review of Systems  Constitutional: Negative for chills and fever.  HENT: Positive for dental problem. Negative for trouble swallowing and voice change.   Respiratory: Negative for stridor.   Gastrointestinal: Negative for nausea and vomiting.     Physical Exam Updated Vital Signs BP 138/85 (BP Location: Right Arm)   Pulse 90   Temp 98.9 F (37.2 C) (Oral)   Resp 18   Ht 5\' 3"  (1.6 m)   Wt 65.8 kg (145 lb)   SpO2 99%   BMI 25.69 kg/m   Physical Exam  Constitutional: She appears well-developed and well-nourished. No distress.  HENT:  Head: Normocephalic and atraumatic.  Mouth/Throat:    Dental cavities and poor oral dentition noted. Pain along tooth as depicted in image. No abscess noted. Midline uvula. No trismus. OP clear and moist. No oropharyngeal erythema or edema. Neck supple with no tenderness. No facial  edema.  Eyes: Conjunctivae are normal. Right eye exhibits no discharge. Left eye exhibits no discharge.  EOMs normal to gross examination.  Neck: Normal range of motion.  Cardiovascular: Normal rate and regular rhythm.  Intact, 2+ radial pulse.  Pulmonary/Chest: Effort normal.  Normal respiratory effort. Patient converses comfortably. No audible wheeze or stridor.  Abdominal: She exhibits no distension.  Musculoskeletal: Normal range of motion.  Neurological: She is alert.  Cranial nerves intact to gross observation. Patient moves extremities without difficulty.  Skin: Skin is warm and dry. She is not diaphoretic.  Psychiatric: She has a  normal mood and affect. Her behavior is normal. Judgment and thought content normal.  Nursing note and vitals reviewed.    ED Treatments / Results  Labs (all labs ordered are listed, but only abnormal results are displayed) Labs Reviewed - No data to display  EKG  EKG Interpretation None       Radiology No results found.  Procedures Procedures (including critical care time)  Medications Ordered in ED Medications  acetaminophen (TYLENOL) tablet 1,000 mg (1,000 mg Oral Given 03/16/17 1251)  penicillin v potassium (VEETID) tablet 500 mg (500 mg Oral Given 03/16/17 1251)     Initial Impression / Assessment and Plan / ED Course  I have reviewed the triage vital signs and the nursing notes.  Pertinent labs & imaging results that were available during my care of the patient were reviewed by me and considered in my medical decision making (see chart for details).      Sarah Larson is a 52 y.o. female who presents to ED for dental pain. Patient is nontoxic-appearing, afebrile, and in no acute distress on examination.  Patient is exhibiting likely pulpitis secondary to eroded tooth. No abscess requiring immediate incision and drainage. Swallowing secretions well. Exam not concerning for Ludwig's angina or pharyngeal abscess. Will treat with penicillin and close dental follow up, which patient has established.  Patient has declined dental block today.  Patient return precautions for any fever, chills, facial swelling, abscess formation, difficulty breathing, difficulty swallowing, neck induration, or submitted bili tenderness.  Patient voices understanding and is agreeable to plan.   Final Clinical Impressions(s) / ED Diagnoses   Final diagnoses:  Pain, dental    ED Discharge Orders        Ordered    penicillin v potassium (VEETID) 500 MG tablet  4 times daily     03/16/17 1249       Delia Chimes 03/16/17 1929    Abelino Derrick, MD 03/17/17 [phone]

## 2017-03-16 NOTE — Discharge Instructions (Signed)
Please see the information and instructions below regarding your visit.  Your diagnoses today include:  1. Pain, dental    You have a dental infection. It is very important that you get evaluated by a dentist as soon as possible. Call tomorrow to schedule an appointment. Ibuprofen/Tylenol as needed for pain. Take your full course of antibiotics. Read the instructions below.  Tests performed today include: See side panel of your discharge paperwork for testing performed today. Vital signs are listed at the bottom of these instructions.   Medications prescribed:    Take any prescribed medications only as prescribed, and any over the counter medications only as directed on the packaging.  1. You are prescribed Penicillin, an antibiotic. Please take all of your antibiotics until finished.   You may develop abdominal discomfort or nausea from the antibiotic. If this occurs, you may take it with food. Some patients also get diarrhea with antibiotics. You may help offset this with probiotics which you can buy or get in yogurt. Do not eat or take the probiotics until 2 hours after your antibiotic. Some women develop vaginal yeast infections after antibiotics. If you develop unusual vaginal discharge after being on this medication, please see your primary care provider.   Some people develop allergies to antibiotics. Symptoms of antibiotic allergy can be mild and include a flat rash and itching. They can also be more serious and include:  ?Hives - Hives are raised, red patches of skin that are usually very itchy.  ?Lip or tongue swelling  ?Trouble swallowing or breathing  ?Blistering of the skin or mouth.  If you have any of these serious symptoms, please seek emergency medical care immediately.  2. You may take ibuprofen, a non-steroidal anti-inflammatory agent (NSAID) for pain. You may take 600mg  every 6 hours as needed for pain. If still requiring this medication around the clock for acute pain  after 10 days, please see your primary healthcare provider.  You may combine this medication with Tylenol, [phone] mg every 6 hours, so you are receiving something for pain every 3 hours. Do not exceed 4 g of Tylenol in one day.  This is not a long-term medication unless under the care and direction of your primary provider. Taking this medication long-term and not under the supervision of a healthcare provider could increase the risk of stomach ulcers, kidney problems, and cardiovascular problems such as high blood pressure.   Home care instructions:  Please follow any educational materials contained in this packet.   Eat a soft or liquid diet and rinse your mouth out after meals with warm water. You should see a dentist or return here at once if you have increased swelling, increased pain or uncontrolled bleeding from the site of your injury.  Follow-up instructions: It is very important that you see a dentist as soon as possible. There is a list of dentists attached to this packet if you do not have care established with a dentist already. Please give a call to a dentist of your choice tomorrow.  Return instructions:  Please return to the Emergency Department if you experience worsening symptoms.  Please seek care if you note any of the following about your dental pain:  You have increased pain not controlled with medicines.  You have swelling around your tooth, in your face or neck.  You have bleeding which starts, continues, or gets worse.  You have a fever >101 If you are unable to open your mouth Please return if you  have any other emergent concerns.  Additional Information:   Your vital signs today were: BP 138/85 (BP Location: Right Arm)    Pulse 90    Temp 98.9 F (37.2 C) (Oral)    Resp 18    Ht 5\' 3"  (1.6 m)    Wt 65.8 kg (145 lb)    SpO2 99%    BMI 25.69 kg/m  If your blood pressure (BP) was elevated on multiple readings during this visit above 130 for the top number or  above 80 for the bottom number, please have this repeated by your primary care provider within one month. --------------  Thank you for allowing us to participate in your care today.

## 2017-03-16 NOTE — ED Triage Notes (Signed)
Pt c/o RT lower dental pain since Sat

## 2017-11-24 ENCOUNTER — Other Ambulatory Visit: Payer: Self-pay

## 2017-11-24 ENCOUNTER — Encounter (HOSPITAL_BASED_OUTPATIENT_CLINIC_OR_DEPARTMENT_OTHER): Payer: Self-pay

## 2017-11-24 ENCOUNTER — Emergency Department (HOSPITAL_BASED_OUTPATIENT_CLINIC_OR_DEPARTMENT_OTHER)
Admission: EM | Admit: 2017-11-24 | Discharge: 2017-11-24 | Disposition: A | Discharge status: Home / Self Care | Payer: 59 | Attending: Emergency Medicine | Admitting: Emergency Medicine

## 2017-11-24 DIAGNOSIS — S0502XA Injury of conjunctiva and corneal abrasion without foreign body, left eye, initial encounter: Secondary | ICD-10-CM | POA: Diagnosis not present

## 2017-11-24 DIAGNOSIS — S0592XA Unspecified injury of left eye and orbit, initial encounter: Secondary | ICD-10-CM | POA: Diagnosis present

## 2017-11-24 DIAGNOSIS — Y999 Unspecified external cause status: Secondary | ICD-10-CM | POA: Diagnosis not present

## 2017-11-24 DIAGNOSIS — Y939 Activity, unspecified: Secondary | ICD-10-CM | POA: Insufficient documentation

## 2017-11-24 DIAGNOSIS — Y929 Unspecified place or not applicable: Secondary | ICD-10-CM | POA: Insufficient documentation

## 2017-11-24 DIAGNOSIS — X58XXXA Exposure to other specified factors, initial encounter: Secondary | ICD-10-CM | POA: Insufficient documentation

## 2017-11-24 DIAGNOSIS — Z79899 Other long term (current) drug therapy: Secondary | ICD-10-CM | POA: Insufficient documentation

## 2017-11-24 DIAGNOSIS — F17210 Nicotine dependence, cigarettes, uncomplicated: Secondary | ICD-10-CM | POA: Diagnosis not present

## 2017-11-24 DIAGNOSIS — I10 Essential (primary) hypertension: Secondary | ICD-10-CM | POA: Insufficient documentation

## 2017-11-24 MED ORDER — FLUORESCEIN SODIUM 1 MG OP STRP
ORAL_STRIP | OPHTHALMIC | Status: AC
  Filled 2017-11-24: qty 1

## 2017-11-24 MED ORDER — CIPROFLOXACIN HCL 0.3 % OP SOLN: 2.0000 [drp] | OPHTHALMIC | 0 refills | Status: AC

## 2017-11-24 MED ORDER — TETRACAINE HCL 0.5 % OP SOLN
OPHTHALMIC | Status: AC
  Filled 2017-11-24: qty 4

## 2017-11-24 NOTE — ED Provider Notes (Signed)
[facility] EMERGENCY DEPARTMENT Provider Note   CSN: [id number] Arrival date & time: 11/24/17  1124     History   Chief Complaint Chief Complaint  Patient presents with  . Eye Problem    HPI Sarah Larson is a 52 y.o. female without pertinent past medical history who presents with left eye redness and irritation.  Her symptoms started on Sunday morning.  She says that this is happened multiple times in the past, but this time it has lasted longer and is a bit more severe than normal.  She does not know of any trauma that could have caused this and has not done any thing at work that would have caused debris to get into her eye.  She denies a fart foreign body sensation, but she said it does feel a little bit irritated.  She denies any acute vision changes.  She says that her pain worsens somewhat with looking upward and to the right.  She denies constitutional symptoms.    Past Medical History:  Diagnosis Date  . A-fib (HCC)   . GERD (gastroesophageal reflux disease)   . Hypertension   . Hypokalemia     There are no active problems to display for this patient.   Past Surgical History:  Procedure Laterality Date  . ABDOMINAL HYSTERECTOMY    . APPENDECTOMY       OB History   None      Home Medications    Prior to Admission medications   Medication Sig Start Date End Date Taking? Authorizing Provider  amLODipine (NORVASC) 5 MG tablet Take 5 mg by mouth daily.    [provider]  ciprofloxacin (CILOXAN) 0.3 % ophthalmic solution Place 2 drops into the left eye every 2 (two) hours while awake for 10 days. 11/24/17 12/04/17  Lennox Solders, MD  doxycycline (VIBRAMYCIN) 100 MG capsule Take 1 capsule (100 mg total) by mouth 2 (two) times daily. One po bid x 7 days 02/14/16   Street, [place], PA-C  METOPROLOL TARTRATE PO Take by mouth.    [provider]  omeprazole (PRILOSEC) 40 MG capsule Take 40 mg by mouth daily.    [provider]    Family History No family history on file.  Social History Social History   Tobacco Use  . Smoking status: Current Every Day Smoker    Types: Cigarettes  . Smokeless tobacco: Never Used  Substance Use Topics  . Alcohol use: Yes    Comment: occasionally  . Drug use: No     Allergies   Patient has no known allergies.   Review of Systems Review of Systems  Constitutional: Negative for activity change, appetite change, chills and fever.  HENT: Negative for congestion.   Eyes: Positive for redness. Negative for photophobia, discharge and visual disturbance.  Respiratory: Negative for shortness of breath.   Cardiovascular: Negative for chest pain.  Gastrointestinal: Negative for abdominal pain.  Genitourinary: Negative for dysuria.  Musculoskeletal: Negative for joint swelling.  Neurological: Negative for headaches.  Psychiatric/Behavioral: The patient is not nervous/anxious.      Physical Exam Updated Vital Signs BP (!) 139/92 (BP Location: Left Arm)   Pulse 82   Temp 98.8 F (37.1 C) (Oral)   Resp 16   Ht 5\' 3"  (1.6 m)   Wt 68 kg   SpO2 100%   BMI 26.57 kg/m   Physical Exam  Constitutional: She is oriented to person, place, and time. She appears well-developed and well-nourished. No  distress.  HENT:  Head: Normocephalic and atraumatic.  Eyes: Pupils are equal, round, and reactive to light. EOM are normal. Right eye exhibits no discharge. Left eye exhibits no discharge.  Injected left conjunctiva, no other abnormality visualized, recurrent tearing from left eye  Neck: Normal range of motion. Neck supple.  Cardiovascular: Normal rate, regular rhythm and normal heart sounds.  Pulmonary/Chest: Effort normal and breath sounds normal. No respiratory distress.  Abdominal: Soft. Bowel sounds are normal. There is no tenderness.  Musculoskeletal: Normal range of motion. She exhibits no edema or deformity.  Neurological: She is alert and oriented to  person, place, and time.  Skin: Skin is warm and dry. She is not diaphoretic.  Psychiatric: She has a normal mood and affect. Her behavior is normal.     ED Treatments / Results  Labs (all labs ordered are listed, but only abnormal results are displayed) Labs Reviewed - No data to display  EKG None  Radiology No results found.  Procedures Procedures (including critical care time)  Medications Ordered in ED Medications  fluorescein 1 MG ophthalmic strip (has no administration in time range)  tetracaine (PONTOCAINE) 0.5 % ophthalmic solution (has no administration in time range)     Initial Impression / Assessment and Plan / ED Course  I have reviewed the triage vital signs and the nursing notes.  Pertinent labs & imaging results that were available during my care of the patient were reviewed by me and considered in my medical decision making (see chart for details).     Corneal ulcer: Florescein stain and Woods lamp show an ulcer inferior to patient's pupil on her left iris.  Prescribe ciprofloxacin drops for treatment, with duration of treatment 10 days.  Tonometry showed a normal intraocular pressure.  Patient was advised on antibiotic use and counseled to see an ophthalmologist for further work-up.  Final Clinical Impressions(s) / ED Diagnoses   Final diagnoses:  Abrasion of left cornea, initial encounter    ED Discharge Orders         Ordered    ciprofloxacin (CILOXAN) 0.3 % ophthalmic solution  Every 2 hours while awake     11/24/17 1349           Lennox Solders, MD 11/24/17 1352    Melene Plan, DO 11/24/17 1546

## 2017-11-24 NOTE — Discharge Instructions (Signed)
Use the antibiotic drops that we have prescribed about every 2 hours, putting 2 drops in your left eye each time.  You do not have to wake up to do this overnight.  Please make an appointment to see an eye doctor about your eye and to check your vision.

## 2017-11-24 NOTE — ED Triage Notes (Signed)
C/o red, swelling to left eye x 3 days-denies injury -NAD-steady gait

## 2018-07-13 ENCOUNTER — Emergency Department (HOSPITAL_BASED_OUTPATIENT_CLINIC_OR_DEPARTMENT_OTHER)
Admission: EM | Admit: 2018-07-13 | Discharge: 2018-07-13 | Disposition: A | Discharge status: Home / Self Care | Payer: Commercial Managed Care - PPO | Attending: Emergency Medicine | Admitting: Emergency Medicine

## 2018-07-13 ENCOUNTER — Other Ambulatory Visit: Payer: Self-pay

## 2018-07-13 ENCOUNTER — Encounter (HOSPITAL_BASED_OUTPATIENT_CLINIC_OR_DEPARTMENT_OTHER): Payer: Self-pay | Admitting: Emergency Medicine

## 2018-07-13 DIAGNOSIS — F17210 Nicotine dependence, cigarettes, uncomplicated: Secondary | ICD-10-CM | POA: Diagnosis not present

## 2018-07-13 DIAGNOSIS — K029 Dental caries, unspecified: Secondary | ICD-10-CM | POA: Diagnosis not present

## 2018-07-13 DIAGNOSIS — K0889 Other specified disorders of teeth and supporting structures: Secondary | ICD-10-CM | POA: Diagnosis present

## 2018-07-13 MED ORDER — PENICILLIN V POTASSIUM 250 MG PO TABS
500.0000 mg | ORAL_TABLET | ORAL | Status: AC
  Administered 2018-07-13: 01:00:00 500 mg via ORAL
  Filled 2018-07-13: qty 2

## 2018-07-13 MED ORDER — NAPROXEN 250 MG PO TABS
500.0000 mg | ORAL_TABLET | Freq: Once | ORAL | Status: AC
  Administered 2018-07-13: 01:00:00 500 mg via ORAL
  Filled 2018-07-13: qty 2

## 2018-07-13 MED ORDER — PENICILLIN V POTASSIUM 500 MG PO TABS: 500.0000 mg | ORAL_TABLET | Freq: Four times a day (QID) | ORAL | 0 refills | Status: DC

## 2018-07-13 MED ORDER — ACETAMINOPHEN 500 MG PO TABS
1000.0000 mg | ORAL_TABLET | Freq: Once | ORAL | Status: AC
  Administered 2018-07-13: 01:00:00 1000 mg via ORAL
  Filled 2018-07-13: qty 2

## 2018-07-13 MED ORDER — LIDOCAINE VISCOUS HCL 2 % MT SOLN
15.0000 mL | Freq: Once | OROMUCOSAL | Status: AC
  Administered 2018-07-13: 15 mL via OROMUCOSAL
  Filled 2018-07-13: qty 15

## 2018-07-13 MED ORDER — NAPROXEN 375 MG PO TABS: 375.0000 mg | ORAL_TABLET | Freq: Two times a day (BID) | ORAL | 0 refills | Status: DC

## 2018-07-13 NOTE — ED Triage Notes (Signed)
Right lower dental pain x 2 days. Denies fever.

## 2018-07-13 NOTE — ED Provider Notes (Signed)
[facility] EMERGENCY DEPARTMENT Provider Note   CSN: [id number] Arrival date & time: 07/13/18  0021     History   Chief Complaint No chief complaint on file.   HPI Sarah Larson is a 53 y.o. female.     The history is provided by the patient.  Dental Pain Location:  Lower Lower teeth location:  31/RL 2nd molar Quality:  Aching Severity:  Severe Onset quality:  Gradual Timing:  Constant Progression:  Unchanged Chronicity:  New Context: dental caries   Previous work-up:  Dental exam and filled cavity Relieved by:  Nothing Worsened by:  Nothing Ineffective treatments:  None tried Associated symptoms: no congestion, no drooling, no facial swelling, no fever, no neck swelling and no trismus   Risk factors: lack of dental care     Past Medical History:  Diagnosis Date  . A-fib ([place])   . GERD (gastroesophageal reflux disease)   . Hypertension   . Hypokalemia     There are no active problems to display for this patient.   Past Surgical History:  Procedure Laterality Date  . ABDOMINAL HYSTERECTOMY    . APPENDECTOMY       OB History   No obstetric history on file.      Home Medications    Prior to Admission medications   Medication Sig Start Date End Date Taking? Authorizing Provider  amLODipine (NORVASC) 5 MG tablet Take 5 mg by mouth daily.    [provider]  doxycycline (VIBRAMYCIN) 100 MG capsule Take 1 capsule (100 mg total) by mouth 2 (two) times daily. One po bid x 7 days 02/14/16   Street, [place], PA-C  METOPROLOL TARTRATE PO Take by mouth.    [provider]  omeprazole (PRILOSEC) 40 MG capsule Take 40 mg by mouth daily.    [provider]    Family History No family history on file.  Social History Social History   Tobacco Use  . Smoking status: Current Every Day Smoker    Types: Cigarettes  . Smokeless tobacco: Never Used  Substance Use Topics  . Alcohol use: Yes    Comment: occasionally  .  Drug use: No     Allergies   Patient has no known allergies.   Review of Systems Review of Systems  Constitutional: Negative for fever.  HENT: Positive for dental problem. Negative for congestion, drooling and facial swelling.   Respiratory: Negative for cough and shortness of breath.   Cardiovascular: Negative for chest pain.  All other systems reviewed and are negative.    Physical Exam Updated Vital Signs There were no vitals taken for this visit.  Physical Exam Constitutional:      General: She is not in acute distress.    Appearance: She is normal weight.  HENT:     Head: Normocephalic and atraumatic.     Nose: Nose normal.     Mouth/Throat:     Mouth: Mucous membranes are moist.     Dentition: Abnormal dentition. No dental caries.     Pharynx: Oropharynx is clear.  Eyes:     Conjunctiva/sclera: Conjunctivae normal.     Pupils: Pupils are equal, round, and reactive to light.  Neck:     Musculoskeletal: Normal range of motion and neck supple.  Cardiovascular:     Rate and Rhythm: Normal rate and regular rhythm.     Pulses: Normal pulses.     Heart sounds: Normal heart sounds.  Pulmonary:     Effort: Pulmonary  effort is normal.     Breath sounds: Normal breath sounds.  Abdominal:     General: Abdomen is flat. Bowel sounds are normal.     Tenderness: There is no abdominal tenderness. There is no guarding.  Musculoskeletal: Normal range of motion.  Skin:    General: Skin is warm and dry.     Capillary Refill: Capillary refill takes less than 2 seconds.  Neurological:     General: No focal deficit present.     Mental Status: She is alert and oriented to person, place, and time.  Psychiatric:        Mood and Affect: Mood normal.        Behavior: Behavior normal.      ED Treatments / Results  Labs (all labs ordered are listed, but only abnormal results are displayed) Labs Reviewed - No data to display  EKG    Radiology No results found.   Procedures Procedures (including critical care time)  Medications Ordered in ED Medications  penicillin v potassium (VEETID) tablet 500 mg (has no administration in time range)  acetaminophen (TYLENOL) tablet 1,000 mg (has no administration in time range)  naproxen (NAPROSYN) tablet 500 mg (has no administration in time range)  lidocaine (XYLOCAINE) 2 % viscous mouth solution 15 mL (has no administration in time range)      Final Clinical Impressions(s) / ED Diagnoses   Return for intractable cough, coughing up blood,fevers >100.4 unrelieved by medication, shortness of breath, intractable vomiting, chest pain, shortness of breath, weakness,numbness, changes in speech, facial asymmetry,abdominal pain, passing out,Inability to tolerate liquids or food, cough, altered mental status or any concerns. No signs of systemic illness or infection. The patient is nontoxic-appearing on exam and vital signs are within normal limits.   I have reviewed the triage vital signs and the nursing notes. Pertinent labs &imaging results that were available during my care of the patient were reviewed by me and considered in my medical decision making (see chart for details).  After history, exam, and medical workup I feel the patient has been appropriately medically screened and is safe for discharge home. Pertinent diagnoses were discussed with the patient. Patient was given return precautions      Joselynne Killam, MD 07/13/18 [id number]

## 2018-07-29 ENCOUNTER — Encounter (HOSPITAL_BASED_OUTPATIENT_CLINIC_OR_DEPARTMENT_OTHER): Payer: Self-pay | Admitting: Emergency Medicine

## 2018-07-29 ENCOUNTER — Emergency Department (HOSPITAL_BASED_OUTPATIENT_CLINIC_OR_DEPARTMENT_OTHER): Payer: Commercial Managed Care - PPO

## 2018-07-29 ENCOUNTER — Other Ambulatory Visit: Payer: Self-pay

## 2018-07-29 ENCOUNTER — Observation Stay (HOSPITAL_BASED_OUTPATIENT_CLINIC_OR_DEPARTMENT_OTHER)
Admission: EM | Admit: 2018-07-29 | Discharge: 2018-07-30 | Disposition: A | Discharge status: Home / Self Care | Payer: Commercial Managed Care - PPO | Attending: Cardiology | Admitting: Cardiology

## 2018-07-29 ENCOUNTER — Ambulatory Visit (HOSPITAL_COMMUNITY)
Admission: EM | Disposition: A | Discharge status: Home / Self Care | Payer: Self-pay | Source: Home / Self Care | Attending: Emergency Medicine

## 2018-07-29 DIAGNOSIS — K219 Gastro-esophageal reflux disease without esophagitis: Secondary | ICD-10-CM | POA: Diagnosis not present

## 2018-07-29 DIAGNOSIS — E876 Hypokalemia: Secondary | ICD-10-CM | POA: Insufficient documentation

## 2018-07-29 DIAGNOSIS — Z1159 Encounter for screening for other viral diseases: Secondary | ICD-10-CM | POA: Insufficient documentation

## 2018-07-29 DIAGNOSIS — R0602 Shortness of breath: Secondary | ICD-10-CM | POA: Diagnosis not present

## 2018-07-29 DIAGNOSIS — R079 Chest pain, unspecified: Secondary | ICD-10-CM | POA: Diagnosis not present

## 2018-07-29 DIAGNOSIS — I471 Supraventricular tachycardia, unspecified: Secondary | ICD-10-CM

## 2018-07-29 DIAGNOSIS — Z79899 Other long term (current) drug therapy: Secondary | ICD-10-CM | POA: Insufficient documentation

## 2018-07-29 DIAGNOSIS — Z720 Tobacco use: Secondary | ICD-10-CM

## 2018-07-29 DIAGNOSIS — I4891 Unspecified atrial fibrillation: Secondary | ICD-10-CM | POA: Insufficient documentation

## 2018-07-29 DIAGNOSIS — I2510 Atherosclerotic heart disease of native coronary artery without angina pectoris: Secondary | ICD-10-CM | POA: Diagnosis not present

## 2018-07-29 DIAGNOSIS — F17210 Nicotine dependence, cigarettes, uncomplicated: Secondary | ICD-10-CM | POA: Insufficient documentation

## 2018-07-29 DIAGNOSIS — I200 Unstable angina: Secondary | ICD-10-CM | POA: Diagnosis not present

## 2018-07-29 DIAGNOSIS — Z881 Allergy status to other antibiotic agents status: Secondary | ICD-10-CM | POA: Insufficient documentation

## 2018-07-29 DIAGNOSIS — Z8249 Family history of ischemic heart disease and other diseases of the circulatory system: Secondary | ICD-10-CM | POA: Diagnosis not present

## 2018-07-29 DIAGNOSIS — I10 Essential (primary) hypertension: Secondary | ICD-10-CM

## 2018-07-29 HISTORY — DX: Supraventricular tachycardia: I47.1

## 2018-07-29 HISTORY — DX: Supraventricular tachycardia, unspecified: I47.10

## 2018-07-29 HISTORY — DX: Other supraventricular tachycardia: I47.19

## 2018-07-29 HISTORY — PX: LEFT HEART CATH AND CORONARY ANGIOGRAPHY: CATH118249

## 2018-07-29 LAB — BASIC METABOLIC PANEL WITH GFR
Anion gap: 9 (ref 5–15)
BUN: 10 mg/dL (ref 6–20)
CO2: 23 mmol/L (ref 22–32)
Calcium: 8.6 mg/dL — ABNORMAL LOW (ref 8.9–10.3)
Chloride: 108 mmol/L (ref 98–111)
Creatinine, Ser: 0.87 mg/dL (ref 0.44–1.00)
GFR calc Af Amer: >60 mL/min
GFR calc non Af Amer: >60 mL/min
Glucose, Bld: 121 mg/dL — ABNORMAL HIGH (ref 70–99)
Potassium: 3.2 mmol/L — ABNORMAL LOW (ref 3.5–5.1)
Sodium: 140 mmol/L (ref 135–145)

## 2018-07-29 LAB — TROPONIN I (HIGH SENSITIVITY)
Troponin I (High Sensitivity): 2 ng/L
Troponin I (High Sensitivity): 2 ng/L

## 2018-07-29 LAB — CBC WITH DIFFERENTIAL/PLATELET
Abs Immature Granulocytes: 0.01 K/uL (ref 0.00–0.07)
Basophils Absolute: 0 K/uL (ref 0.0–0.1)
Basophils Relative: 0 %
Eosinophils Absolute: 0.1 K/uL (ref 0.0–0.5)
Eosinophils Relative: 1 %
HCT: 35.4 % — ABNORMAL LOW (ref 36.0–46.0)
Hemoglobin: 11.9 g/dL — ABNORMAL LOW (ref 12.0–15.0)
Immature Granulocytes: 0 %
Lymphocytes Relative: 42 %
Lymphs Abs: 4.3 K/uL — ABNORMAL HIGH (ref 0.7–4.0)
MCH: 31.1 pg (ref 26.0–34.0)
MCHC: 33.6 g/dL (ref 30.0–36.0)
MCV: 92.4 fL (ref 80.0–100.0)
Monocytes Absolute: 0.8 K/uL (ref 0.1–1.0)
Monocytes Relative: 8 %
Neutro Abs: 5.1 K/uL (ref 1.7–7.7)
Neutrophils Relative %: 49 %
Platelets: 233 K/uL (ref 150–400)
RBC: 3.83 MIL/uL — ABNORMAL LOW (ref 3.87–5.11)
RDW: 13.7 % (ref 11.5–15.5)
WBC: 10.3 K/uL (ref 4.0–10.5)
nRBC: 0 % (ref 0.0–0.2)

## 2018-07-29 LAB — SARS CORONAVIRUS 2 AG (30 MIN TAT): SARS Coronavirus 2 Ag: NEGATIVE

## 2018-07-29 SURGERY — LEFT HEART CATH AND CORONARY ANGIOGRAPHY
Anesthesia: LOCAL

## 2018-07-29 MED ORDER — LOSARTAN POTASSIUM 25 MG PO TABS
25.0000 mg | ORAL_TABLET | Freq: Every day | ORAL | Status: DC
  Administered 2018-07-30: 09:00:00 25 mg via ORAL
  Filled 2018-07-29: qty 1

## 2018-07-29 MED ORDER — POTASSIUM CHLORIDE CRYS ER 20 MEQ PO TBCR
40.0000 meq | EXTENDED_RELEASE_TABLET | Freq: Once | ORAL | Status: AC
  Administered 2018-07-29: 04:00:00 40 meq via ORAL
  Filled 2018-07-29: qty 2

## 2018-07-29 MED ORDER — SODIUM CHLORIDE 0.9 % WEIGHT BASED INFUSION: 3.0000 mL/kg/h | INTRAVENOUS | Status: DC

## 2018-07-29 MED ORDER — SODIUM CHLORIDE 0.9% FLUSH: 3.0000 mL | INTRAVENOUS | Status: DC | PRN

## 2018-07-29 MED ORDER — MIDAZOLAM HCL 2 MG/2ML IJ SOLN
INTRAMUSCULAR | Status: AC
  Filled 2018-07-29: qty 2

## 2018-07-29 MED ORDER — NITROGLYCERIN 2 % TD OINT
1.0000 [in_us] | TOPICAL_OINTMENT | Freq: Once | TRANSDERMAL | Status: AC
  Administered 2018-07-29: 1 [in_us] via TOPICAL
  Filled 2018-07-29: qty 1

## 2018-07-29 MED ORDER — SODIUM CHLORIDE 0.9 % IV SOLN: INTRAVENOUS | Status: AC

## 2018-07-29 MED ORDER — SODIUM CHLORIDE 0.9% FLUSH
3.0000 mL | Freq: Two times a day (BID) | INTRAVENOUS | Status: DC
  Administered 2018-07-30: 3 mL via INTRAVENOUS

## 2018-07-29 MED ORDER — IOHEXOL 350 MG/ML SOLN
INTRAVENOUS | Status: DC | PRN
  Administered 2018-07-29: 17:00:00 45 mL via INTRAVENOUS

## 2018-07-29 MED ORDER — FENTANYL CITRATE (PF) 100 MCG/2ML IJ SOLN
INTRAMUSCULAR | Status: DC | PRN
  Administered 2018-07-29: 50 ug via INTRAVENOUS
  Administered 2018-07-29: 25 ug via INTRAVENOUS

## 2018-07-29 MED ORDER — HEPARIN (PORCINE) IN NACL 1000-0.9 UT/500ML-% IV SOLN
INTRAVENOUS | Status: AC
  Filled 2018-07-29: qty 500

## 2018-07-29 MED ORDER — MIDAZOLAM HCL 2 MG/2ML IJ SOLN
INTRAMUSCULAR | Status: DC | PRN
  Administered 2018-07-29: 2 mg via INTRAVENOUS
  Administered 2018-07-29: 1 mg via INTRAVENOUS

## 2018-07-29 MED ORDER — DILTIAZEM HCL ER COATED BEADS 180 MG PO CP24
180.0000 mg | ORAL_CAPSULE | Freq: Every day | ORAL | Status: DC
  Administered 2018-07-30: 09:00:00 180 mg via ORAL
  Filled 2018-07-29: qty 1

## 2018-07-29 MED ORDER — ASPIRIN 81 MG PO CHEW
324.0000 mg | CHEWABLE_TABLET | Freq: Once | ORAL | Status: AC
  Administered 2018-07-29: 03:00:00 324 mg via ORAL
  Filled 2018-07-29: qty 4

## 2018-07-29 MED ORDER — SODIUM CHLORIDE 0.9 % IV SOLN: 250.0000 mL | INTRAVENOUS | Status: DC | PRN

## 2018-07-29 MED ORDER — HEPARIN SODIUM (PORCINE) 1000 UNIT/ML IJ SOLN
INTRAMUSCULAR | Status: AC
  Filled 2018-07-29: qty 1

## 2018-07-29 MED ORDER — ONDANSETRON HCL 4 MG/2ML IJ SOLN: 4.0000 mg | Freq: Four times a day (QID) | INTRAMUSCULAR | Status: DC | PRN

## 2018-07-29 MED ORDER — METOPROLOL SUCCINATE ER 50 MG PO TB24
50.0000 mg | ORAL_TABLET | Freq: Every day | ORAL | Status: DC
  Administered 2018-07-30: 09:00:00 50 mg via ORAL
  Filled 2018-07-29: qty 1

## 2018-07-29 MED ORDER — POTASSIUM CHLORIDE CRYS ER 20 MEQ PO TBCR
40.0000 meq | EXTENDED_RELEASE_TABLET | Freq: Once | ORAL | Status: AC
  Administered 2018-07-29: 07:00:00 40 meq via ORAL
  Filled 2018-07-29: qty 2

## 2018-07-29 MED ORDER — HEPARIN SODIUM (PORCINE) 1000 UNIT/ML IJ SOLN
INTRAMUSCULAR | Status: DC | PRN
  Administered 2018-07-29: 3500 [IU] via INTRAVENOUS

## 2018-07-29 MED ORDER — LABETALOL HCL 5 MG/ML IV SOLN: 10.0000 mg | INTRAVENOUS | Status: AC | PRN

## 2018-07-29 MED ORDER — HEPARIN (PORCINE) IN NACL 1000-0.9 UT/500ML-% IV SOLN
INTRAVENOUS | Status: DC | PRN
  Administered 2018-07-29 (×2): 500 mL

## 2018-07-29 MED ORDER — ACETAMINOPHEN 325 MG PO TABS: 650.0000 mg | ORAL_TABLET | ORAL | Status: DC | PRN

## 2018-07-29 MED ORDER — NITROGLYCERIN 0.4 MG SL SUBL
0.4000 mg | SUBLINGUAL_TABLET | SUBLINGUAL | Status: AC | PRN
  Administered 2018-07-29 (×3): 0.4 mg via SUBLINGUAL
  Filled 2018-07-29: qty 1

## 2018-07-29 MED ORDER — LIDOCAINE HCL (PF) 1 % IJ SOLN
INTRAMUSCULAR | Status: AC
  Filled 2018-07-29: qty 30

## 2018-07-29 MED ORDER — ASPIRIN 81 MG PO CHEW: 81.0000 mg | CHEWABLE_TABLET | ORAL | Status: DC

## 2018-07-29 MED ORDER — VERAPAMIL HCL 2.5 MG/ML IV SOLN
INTRAVENOUS | Status: DC | PRN
  Administered 2018-07-29: 10 mL via INTRA_ARTERIAL

## 2018-07-29 MED ORDER — SODIUM CHLORIDE 0.9% FLUSH
3.0000 mL | Freq: Two times a day (BID) | INTRAVENOUS | Status: DC
  Administered 2018-07-29 – 2018-07-30 (×2): 3 mL via INTRAVENOUS

## 2018-07-29 MED ORDER — HYDRALAZINE HCL 20 MG/ML IJ SOLN: 10.0000 mg | INTRAMUSCULAR | Status: AC | PRN

## 2018-07-29 MED ORDER — SODIUM CHLORIDE 0.9 % WEIGHT BASED INFUSION: 1.0000 mL/kg/h | INTRAVENOUS | Status: DC

## 2018-07-29 MED ORDER — LIDOCAINE HCL (PF) 1 % IJ SOLN
INTRAMUSCULAR | Status: DC | PRN
  Administered 2018-07-29: 2 mL

## 2018-07-29 MED ORDER — ACETAMINOPHEN 325 MG PO TABS
650.0000 mg | ORAL_TABLET | Freq: Once | ORAL | Status: AC
  Administered 2018-07-29: 04:00:00 650 mg via ORAL
  Filled 2018-07-29: qty 2

## 2018-07-29 MED ORDER — FENTANYL CITRATE (PF) 100 MCG/2ML IJ SOLN
INTRAMUSCULAR | Status: AC
  Filled 2018-07-29: qty 2

## 2018-07-29 SURGICAL SUPPLY — 9 items

## 2018-07-29 NOTE — Plan of Care (Signed)
  Problem: Clinical Measurements: Goal: Ability to maintain clinical measurements within normal limits will improve Outcome: Progressing   

## 2018-07-29 NOTE — ED Provider Notes (Signed)
I received the patient in signout from Dr. Roxanne Mins.  Briefly the patient is a 53 year old female with a chief complaint of chest pain.  This was typical in nature and she had the pain off and on that resolved with nitroglycerin.  EKG was unchanged.  She had a negative nuclear stress test back in January of this year.  She had a CT scan of the chest with contrast that showed that she had atherosclerotic and coronary calcifications.  I discussed the case with Dr. Meda Coffee, cardiology will accept onto the cardiology service at [facility].    Deno Etienne, DO 07/29/18 [phone]

## 2018-07-29 NOTE — Interval H&P Note (Signed)
Cath Lab Visit (complete for each Cath Lab visit)  Clinical Evaluation Leading to the Procedure:   ACS: Yes.    Non-ACS:    Anginal Classification: CCS IV  Anti-ischemic medical therapy: Minimal Therapy (1 class of medications)  Non-Invasive Test Results: No non-invasive testing performed  Prior CABG: No previous CABG      History and Physical Interval Note:  07/29/2018 4:16 PM  Sarah Larson  has presented today for surgery, with the diagnosis of unstable angina.  The various methods of treatment have been discussed with the patient and family. After consideration of risks, benefits and other options for treatment, the patient has consented to  Procedure(s): LEFT HEART CATH AND CORONARY ANGIOGRAPHY (N/A) as a surgical intervention.  The patient's history has been reviewed, patient examined, no change in status, stable for surgery.  I have reviewed the patient's chart and labs.  Questions were answered to the patient's satisfaction.     Larae Grooms

## 2018-07-29 NOTE — ED Provider Notes (Signed)
[facility] EMERGENCY DEPARTMENT Provider Note   CSN: [id number] Arrival date & time: 07/29/18  0226    History   Chief Complaint Chief Complaint  Patient presents with  . Chest Pain    HPI Sarah Larson is a 53 y.o. female.   The history is provided by the patient.  She has history of hypertension and GERD and comes in because of chest pain.  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  She described a pressure sensation in her left chest and a more dull feeling in the lower sternal area.  Initial pain was rated at 10/10 but has subsided to 2/10.  There was mild associated dyspnea, nausea, diaphoresis.  She has not taken anything for the pain.  Nothing seems to make it better, nothing makes it worse.  She does have history of hypertension and is a cigarette smoker.  She denies history of diabetes, hyperlipidemia or family history of premature coronary atherosclerosis.  She has been evaluated by cardiology and had a stress test earlier this year which was reported to be normal.  She is supposed to follow-up with cardiology because a chest CT scan showed coronary artery calcification.  Past Medical History:  Diagnosis Date  . A-fib ([place])   . GERD (gastroesophageal reflux disease)   . Hypertension   . Hypokalemia     There are no active problems to display for this patient.   Past Surgical History:  Procedure Laterality Date  . ABDOMINAL HYSTERECTOMY    . APPENDECTOMY       OB History   No obstetric history on file.      Home Medications    Prior to Admission medications   Medication Sig Start Date End Date Taking? Authorizing Provider  diltiazem (DILACOR XR) 180 MG 24 hr capsule Take 180 mg by mouth daily.   Yes [provider]  losartan (COZAAR) 25 MG tablet Take 25 mg by mouth daily.   Yes [provider]  amLODipine (NORVASC) 5 MG tablet Take 5 mg by mouth daily.     [provider]  doxycycline (VIBRAMYCIN) 100 MG capsule Take 1 capsule (100 mg total) by mouth 2 (two) times daily. One po bid x 7 days 02/14/16   Street, [place], PA-C  METOPROLOL TARTRATE PO Take by mouth.    [provider]  naproxen (NAPROSYN) 375 MG tablet Take 1 tablet (375 mg total) by mouth 2 (two) times daily. 07/13/18   Palumbo, April, MD  omeprazole (PRILOSEC) 40 MG capsule Take 40 mg by mouth daily.    [provider]  penicillin v potassium (VEETID) 500 MG tablet Take 1 tablet (500 mg total) by mouth 4 (four) times daily. 07/13/18   Palumbo, April, MD    Family History History reviewed. No pertinent family history.  Social History Social History   Tobacco Use  . Smoking status: Current Every Day Smoker    Types: Cigarettes  . Smokeless tobacco: Never Used  Substance Use Topics  . Alcohol use: Yes    Comment: occasionally  . Drug use: No     Allergies   Patient has no known allergies.   Review of Systems Review of Systems  All other systems reviewed and are negative.    Physical Exam Updated Vital Signs BP (!) 174/111 (BP Location: Right Arm)   Pulse 89   Temp 98.5 F (36.9 C) (Oral)   Resp 18  Ht 5\' 3"  (1.6 m)   Wt 65.8 kg   SpO2 100%   BMI 25.70 kg/m   Physical Exam Vitals signs and nursing note reviewed.    53 year old female, resting comfortably and in no acute distress. Vital signs are significant for elevated blood pressure. Oxygen saturation is 100%, which is normal. Head is normocephalic and atraumatic. PERRLA, EOMI. Oropharynx is clear. Neck is nontender and supple without adenopathy or JVD. Back is nontender and there is no CVA tenderness. Lungs are clear without rales, wheezes, or rhonchi. Chest is nontender. Heart has regular rate and rhythm without murmur. Abdomen is soft, flat, nontender without masses or hepatosplenomegaly and peristalsis is normoactive. Extremities have no cyanosis or edema, full range  of motion is present. Skin is warm and dry without rash. Neurologic: Mental status is normal, cranial nerves are intact, there are no motor or sensory deficits.  ED Treatments / Results  Labs (all labs ordered are listed, but only abnormal results are displayed) Labs Reviewed - No data to display  EKG EKG Interpretation  Date/Time:  Thursday July 29 2018 02:37:14 EDT Ventricular Rate:  86 PR Interval:    QRS Duration: 87 QT Interval:  379 QTC Calculation: 454 R Axis:   11 Text Interpretation:  Sinus rhythm Normal ECG When compared with ECG of 10/06/2016, No significant change was found Confirmed by Dione BoozeGlick, Kashvi Prevette ([id number]) on 07/29/2018 2:53:01 AM   Radiology No results found.  Procedures Procedures   Medications Ordered in ED Medications - No data to display   Initial Impression / Assessment and Plan / ED Course  I have reviewed the triage vital signs and the nursing notes.  Pertinent labs & imaging results that were available during my care of the patient were reviewed by me and considered in my medical decision making (see chart for details).  Chest pain of uncertain cause.  Old records are reviewed confirming normal nuclear stress test on January 13, CT of chest with contrast on June 23 showing evidence of coronary artery calcifications.  Also, on June 26, she had a normal HIDA scan.  ECG shows no acute changes, but I am concerned about chest pressure which came on with exertion in a patient with known coronary artery calcifications.  Will check chest x-ray and high-sensitivity troponin.  She is given a dose of oral aspirin.  Initial high-sensitivity troponin is normal.  Patient did have relief of pressure with nitroglycerin, but it recurred.  This occurred with 3 doses of nitroglycerin.  Will place on nitroglycerin ointment.  Heart score is 4, which place her at elevated risk for major adverse cardiac events.  Given temporary relief with nitroglycerin, I feel she should be  admitted for further evaluation.  Also, hypokalemia is noted and she is given oral potassium.  Case has been discussed with [facility] Sheliah [facility]abbat [facility] who informs me that there are no beds available.  That would have been the preferred location since that is where her cardiologist practices.  [facility]  Final Clinical Impressions(s) / ED Diagnoses   Final diagnoses:  Nonspecific chest pain  Coronary artery calcification seen on CAT scan  Hypokalemia    ED Discharge Orders    None       Dione BoozeGlick, Keion Neels, MD 07/29/18 [phone]

## 2018-07-29 NOTE — Plan of Care (Signed)
Problem: Coping: Goal: Level of anxiety will decrease 07/29/2018 2311 by Dulcy Fanny, RN Outcome: Adequate for Discharge 07/29/2018 2309 by Dulcy Fanny, RN Outcome: Adequate for Discharge   Problem: Education: Goal: Knowledge of General Education information will improve Description: Including pain rating scale, medication(s)/side effects and non-pharmacologic comfort measures Outcome: Completed/Met

## 2018-07-29 NOTE — H&P (Addendum)
Cardiology Admission History and Physical:   Patient ID: Sarah Larson MRN: [id number]; DOB: 1965-09-13   Admission date: 07/29/2018  Primary Care Provider: Patient, No Pcp Per Primary Cardiologist: Dr. Corky Singheek/[facility] Primary Electrophysiologist:  None   Chief Complaint:  Chest pain  Patient Profile:   Sarah Larson is a 53 y.o. female with past medical history of hypertension tobacco use, SVT who presented to med [facility] [place] with chest pain.  History of Present Illness:   Sarah Larson is a 53-year-old female with past medical history noted above.  She is [facility] cardiology by Dr. Beverely Paceheek as an outpatient.  She denies any family history of CAD.  She is previously wore a ZIO patch with no noted an episode of atrial tachycardia.  Her home medications were switched from amlodipine to Cardizem with improvement.  She reports a six-month history of chest pain.  It was appeared somewhat atypical as they would appear after eating, and other times had no association with rest or activity. She recently underwent a stress test back in January 2020 which noted no notable ischemia.  Appears she has underwent an extensive GI work-up with colonoscopy and EGD.  Colonoscopy noted some polyps which were removed, and EGD with gastric biopsy showed gastritis.  H. pylori test was negative.  Appears that GI was considering doing a HIDA scan to further evaluate her gallbladder. States she was placed on PPI thereafter and did not experience any relief in symptoms.  Therefore stopped taking.  She was recently seen in the office on 07/08/2018 and symptoms are felt to be atypical in nature.  Decision was made to forego cardiac cath at that time.  Blood pressure was noted to be elevated in the office and she was placed on losartan 25 mg daily in addition to diltiazem and metoprolol.  Notes indicated she had a recent CT chest ordered by her PCP.  In review of this and noted coronary calcifications and aortic  atherosclerosis.  Reports she was in her usual state of health while at work yesterday afternoon.  Currently works as a Location managermachine operator.  Tends to lift heavy boxes throughout her shift.  Does not normally associate any anginal symptoms with activity.  Yesterday she experienced centralized chest pressure/heaviness with her work activity.  States she had been picking up boxes.  Symptoms improved when she sat down and rested.  Was able to finish her shift, and went home but symptoms persisted.  Did have some associated shortness of breath.  Symptoms lingered after she arrived home and eventually presented to the med [facility] [place] for further evaluation.  In the ED her labs showed electrolytes with the exception of a potassium of 3.2 (supplemented), high-sensitivity troponin 2, hemoglobin 11.9. EKG SR (rate 86) with nonspecific changes in aVL. She was given 3 SL nitro which resolved her symptoms completely. Given concern for ACS she was [facility] work [facility]. No chest pain on arrival.    Heart Pathway Score:  HEAR Score: 4  Past Medical History:  Diagnosis Date  . Atrial tachycardia (HCC)   . GERD (gastroesophageal reflux disease)   . Hypertension   . Hypokalemia   . SVT (supraventricular tachycardia) (HCC)     Past Surgical History:  Procedure Laterality Date  . ABDOMINAL HYSTERECTOMY    . APPENDECTOMY       Medications Prior to Admission: Prior to Admission medications   Medication Sig Start Date End Date Taking? Authorizing Provider  diltiazem [facility](DILACOR  XR) 180 MG 24 hr capsule Take 180 mg by mouth daily.   Yes [provider]  losartan (COZAAR) 25 MG tablet Take 25 mg by mouth daily.   Yes [provider]  metoprolol succinate (TOPROL-XL) 25 MG 24 hr tablet Take 50 mg by mouth daily.   Yes [provider]  naproxen (NAPROSYN) 375 MG tablet Take 1 tablet (375 mg total) by mouth 2 (two) times daily. Patient not taking: Reported on 07/29/2018  07/13/18   Randal Buba, April, MD     Allergies:    Allergies  Allergen Reactions  . Metronidazole Itching    Social History:   Social History   Socioeconomic History  . Marital status: Single    Spouse name: Not on file  . Number of children: Not on file  . Years of education: Not on file  . Highest education level: Not on file  Occupational History  . Not on file  Social Needs  . Financial resource strain: Not on file  . Food insecurity    Worry: Not on file    Inability: Not on file  . Transportation needs    Medical: Not on file    Non-medical: Not on file  Tobacco Use  . Smoking status: Current Every Day Smoker    Types: Cigarettes  . Smokeless tobacco: Never Used  Substance and Sexual Activity  . Alcohol use: Yes    Comment: occasionally  . Drug use: No  . Sexual activity: Not on file  Lifestyle  . Physical activity    Days per week: Not on file    Minutes per session: Not on file  . Stress: Not on file  Relationships  . Social Herbalist on phone: Not on file    Gets together: Not on file    Attends religious service: Not on file    Active member of club or organization: Not on file    Attends meetings of clubs or organizations: Not on file    Relationship status: Not on file  . Intimate partner violence    Fear of current or ex partner: Not on file    Emotionally abused: Not on file    Physically abused: Not on file    Forced sexual activity: Not on file  Other Topics Concern  . Not on file  Social History Narrative  . Not on file    Family History:   The patient's family history includes Hypertension in her father.    ROS:  Please see the history of present illness.  All other ROS reviewed and negative.     Physical Exam/Data:   Vitals:   07/29/18 1000 07/29/18 1154 07/29/18 1156 07/29/18 1334  BP: (!) 146/103 (!) 147/97  (!) 164/94  Pulse: 66  61 61  Resp: (!) 22  17   Temp:    98.5 F (36.9 C)  TempSrc:    Oral  SpO2: 99%   100% 100%  Weight:      Height:    5' 2.5" (1.588 m)   No intake or output data in the 24 hours ending 07/29/18 1559 Last 3 Weights 07/29/2018 07/13/2018 11/24/2017  Weight (lbs) 145 lb 1 oz 145 lb 150 lb  Weight (kg) 65.8 kg 65.772 kg 68.04 kg     Body mass index is 26.11 kg/m.  General:  Well nourished, well developed, in no acute distress HEENT: normal Neck: no JVD Endocrine:  No thryomegaly Vascular: No carotid bruits Cardiac:  normal S1, S2; RRR; no murmur  Lungs:  clear to auscultation bilaterally, no wheezing, rhonchi or rales  Abd: soft, nontender, no hepatomegaly  Ext: no edema Musculoskeletal:  No deformities, BUE and BLE strength normal and equal Skin: warm and dry  Neuro:  CNs 2-12 intact, no focal abnormalities noted Psych:  Normal affect    EKG:  The ECG that was done 07/29/18 was personally reviewed and demonstrates SR with nonspecific ST changes in aVL  Relevant CV Studies:  Recent stress test 1/20:   Procedure Type:  Nuclear Stress Test: [facility] MYOCARDIAL PERFUSION STRESS Indications: Chest pain.  Conclusions  Summary  Both stress and rest images demonstrate normal uptake in all walls.  There is gut uptake noted on stress and rest images which appears to  subtract counts from the anterior wall.  The calculated EF is 63% .  There is no convincing evidence of inducible ischemia on this study.  Laboratory Data:  High Sensitivity Troponin:   Recent Labs  Lab 07/29/18 0319 07/29/18 0506  TROPONINIHS 2 2      Cardiac EnzymesNo results for input(s): TROPONINI in the last 168 hours. No results for input(s): TROPIPOC in the last 168 hours.  Chemistry Recent Labs  Lab 07/29/18 0305  NA 140  K 3.2*  CL 108  CO2 23  GLUCOSE 121*  BUN 10  CREATININE 0.87  CALCIUM 8.6*  GFRNONAA >60  GFRAA >60  ANIONGAP 9    No results for input(s): PROT, ALBUMIN, AST, ALT, ALKPHOS, BILITOT in the last 168 hours. Hematology Recent Labs  Lab 07/29/18 0305  WBC 10.3   RBC 3.83*  HGB 11.9*  HCT 35.4*  MCV 92.4  MCH 31.1  MCHC 33.6  RDW 13.7  PLT 233   BNPNo results for input(s): BNP, PROBNP in the last 168 hours.  DDimer No results for input(s): DDIMER in the last 168 hours.   Radiology/Studies:  Dg Chest Port 1 View  Result Date: 07/29/2018 CLINICAL DATA:  Chest pain EXAM: PORTABLE CHEST 1 VIEW COMPARISON:  06/30/2018, 08/15/2011 FINDINGS: The heart size and mediastinal contours are within normal limits. Both lungs are clear. The visualized skeletal structures are unremarkable. IMPRESSION: No active disease. Electronically Signed   By: Jasmine PangKim  Fujinaga M.D.   On: 07/29/2018 03:36    Assessment and Plan:   Sarah Larson is a 53 y.o. female with past medical history of hypertension tobacco use, SVT who presented to med [facility] [place] with chest pain.  1. Chest pain: symptoms have been present for several months, and previously associated with GI issues.  Today symptoms developed with exertion while at work and were relieved with rest and sublingual nitroglycerin.  High-sensitivity troponin was 2. EKG with no acute ischemia.  Does have concerning risk factors with tobacco use and uncontrolled hypertension.  With recent normal stress testing, would benefit from further ischemic work-up with cardiac cath given ongoing symptoms that are now exertional and relieved with nitro.  -- The patient understands that risks included but are not limited to stroke (1 in 1000), death (1 in 1000), kidney failure [usually temporary] (1 in 500), bleeding (1 in 200), allergic reaction [possibly serious] (1 in 200).  -- check Hgb A1c and Lipids  2. HTN: will continue home medications. Toprol, Diltiazem, and Losartan.  3. Hypokalemia: replaced prior to transfer  4. SVT: controlled with BB and Dilt therapy  5. Tobacco use: cessation advised.    Severity of Illness: The appropriate patient status for this patient  is OBSERVATION. Observation status is judged to be  reasonable and necessary in order to provide the required intensity of service to ensure the patient's safety. The patient's presenting symptoms, physical exam findings, and initial radiographic and laboratory data in the context of their medical condition is felt to place them at decreased risk for further clinical deterioration. Furthermore, it is anticipated that the patient will be medically stable for discharge from the hospital within 2 midnights of admission. The following factors support the patient status of observation.   " The patient's presenting symptoms include chest pain. " The physical exam findings include stable exam. " The initial radiographic and laboratory data are stable.   For questions or updates, please contact [facility] Please consult [email or web address] for contact info under    Signed, Laverda PageLindsay Roberts, NP  07/29/2018 3:59 PM   Attending Note:   The patient was seen and examined.  Agree with assessment and plan as noted above.  Changes made to the above note as needed.  Patient seen and independently examined with  Laverda PageLindsay Roberts, NP .   We discussed all aspects of the encounter. I agree with the assessment and plan as stated above.  1.   Chest pressure / unstable angina:   Pt has had chest pressure for 6 months or so.   Had a negative stress myoview.  Has continued to have chest pressure Today had chest pressure with exertion / at work .  Radiated to her intrascapular region  Came to med [facility] and CP was relieved 3 separate times ( would return 20 min later )  HR trop is negative.   Given her symptoms, I think our best plan going forward to proceed with cardiac cath.   She is having unstable symptoms and has already had a gi work u   We have discussed cardiac cath .  Discussed risks, benefits, options. She understands and agrees to proceed.    I have spent a total of 40 minutes with patient reviewing hospital  notes , telemetry, EKGs, labs and examining  patient as well as establishing an assessment and plan that was discussed with the patient. > 50% of time was spent in direct patient care.    Vesta MixerPhilip J. Nahser, Montez HagemanJr., MD, [facility] 07/29/2018, 4:07 PM [address],  [address] Office [phone] Pager [phone]- [phone]

## 2018-07-29 NOTE — ED Triage Notes (Signed)
Patient complains of right and mid chest pain; states feels like a cramp in her chest; states became worse after lifting heavy boxes at work. NAD noted.

## 2018-07-30 DIAGNOSIS — Z720 Tobacco use: Secondary | ICD-10-CM

## 2018-07-30 DIAGNOSIS — I471 Supraventricular tachycardia, unspecified: Secondary | ICD-10-CM

## 2018-07-30 DIAGNOSIS — I10 Essential (primary) hypertension: Secondary | ICD-10-CM

## 2018-07-30 DIAGNOSIS — R079 Chest pain, unspecified: Secondary | ICD-10-CM | POA: Diagnosis not present

## 2018-07-30 LAB — LIPID PANEL
Cholesterol: 169 mg/dL (ref 0–200)
HDL: 40 mg/dL — ABNORMAL LOW (ref >40)
LDL Cholesterol: 115 mg/dL — ABNORMAL HIGH (ref 0–99)
Total CHOL/HDL Ratio: 4.2 RATIO
Triglycerides: 69 mg/dL (ref <150)
VLDL: 14 mg/dL (ref 0–40)

## 2018-07-30 MED ORDER — ISOSORBIDE MONONITRATE ER 30 MG PO TB24
15.0000 mg | ORAL_TABLET | Freq: Every day | ORAL | Status: DC
  Administered 2018-07-30: 09:00:00 15 mg via ORAL
  Filled 2018-07-30: qty 1

## 2018-07-30 MED ORDER — ISOSORBIDE MONONITRATE ER 30 MG PO TB24: 15.0000 mg | ORAL_TABLET | Freq: Every day | ORAL | 3 refills | Status: AC

## 2018-07-30 MED ORDER — ATORVASTATIN CALCIUM 10 MG PO TABS
20.0000 mg | ORAL_TABLET | Freq: Every day | ORAL | Status: DC
  Administered 2018-07-30: 09:00:00 20 mg via ORAL
  Filled 2018-07-30: qty 2

## 2018-07-30 MED ORDER — ASPIRIN 81 MG PO TBEC: 81.0000 mg | DELAYED_RELEASE_TABLET | Freq: Every day | ORAL | Status: AC

## 2018-07-30 MED ORDER — ATORVASTATIN CALCIUM 20 MG PO TABS: 20.0000 mg | ORAL_TABLET | Freq: Every day | ORAL | 3 refills | Status: AC

## 2018-07-30 MED ORDER — ASPIRIN EC 81 MG PO TBEC
81.0000 mg | DELAYED_RELEASE_TABLET | Freq: Every day | ORAL | Status: DC
  Administered 2018-07-30: 09:00:00 81 mg via ORAL
  Filled 2018-07-30: qty 1

## 2018-07-30 MED ORDER — NITROGLYCERIN 0.4 MG SL SUBL: 0.4000 mg | SUBLINGUAL_TABLET | SUBLINGUAL | 3 refills | Status: AC | PRN

## 2018-07-30 NOTE — Discharge Instructions (Signed)

## 2018-07-30 NOTE — Progress Notes (Addendum)
Patient states her PCP is Environmental consultant at [facility] internal Medicine in [place], she will need to call and make a follow up apt, their office is closed today.  Per Cath no obstructive CAD.

## 2018-07-30 NOTE — Progress Notes (Signed)
Progress Note  Patient Name: Sarah Larson Date of Encounter: 07/30/2018  Primary Cardiologist: Sarah Mandrileide ScalesHerman Cheek, MD [facility]  Subjective   Feels well.  No chest pain or shortness of breath.  No pain at right radial cath site.  Inpatient Medications    Scheduled Meds: . diltiazem  180 mg Oral Daily  . isosorbide mononitrate  15 mg Oral Daily  . losartan  25 mg Oral Daily  . metoprolol succinate  50 mg Oral Daily  . sodium chloride flush  3 mL Intravenous Q12H  . sodium chloride flush  3 mL Intravenous Q12H   Continuous Infusions: . sodium chloride     PRN Meds: sodium chloride, acetaminophen, ondansetron (ZOFRAN) IV, sodium chloride flush   Vital Signs    Vitals:   07/29/18 1642 07/29/18 1647 07/29/18 2105 07/30/18 0606  BP: 123/85 129/86 (!) 148/86 (!) 156/94  Pulse: 67 68 63 67  Resp: 17 19    Temp:   98 F (36.7 C) 97.7 F (36.5 C)  TempSrc:   Oral Oral  SpO2: 100% 100% 100% 99%  Weight:    63.3 kg  Height:        Intake/Output Summary (Last 24 hours) at 07/30/2018 0746 Last data filed at 07/30/2018 0500 Gross per 24 hour  Intake 858 ml  Output -  Net 858 ml   Last 3 Weights 07/30/2018 07/29/2018 07/13/2018  Weight (lbs) 139 lb 8 oz 145 lb 1 oz 145 lb  Weight (kg) 63.277 kg 65.8 kg 65.772 kg      Telemetry    Sinus bradycardia and normal sinus rhythm. - Personally Reviewed  ECG    No new tracing.  Physical Exam   GEN: No acute distress.   Neck: No JVD Cardiac: RRR, no murmurs, rubs, or gallops.  Respiratory: Clear to auscultation bilaterally. GI: Soft, nontender, non-distended  MS: No edema; No deformity.  Right radial arteriotomy site covered with clean dressing.  No hematoma.  2+ right radial pulse. Neuro:  Nonfocal  Psych: Normal affect   Labs    High Sensitivity Troponin:   Recent Labs  Lab 07/29/18 0319 07/29/18 0506  TROPONINIHS 2 2      Cardiac EnzymesNo results for input(s): TROPONINI in the last 168 hours. No results for  input(s): TROPIPOC in the last 168 hours.   Chemistry Recent Labs  Lab 07/29/18 0305  NA 140  K 3.2*  CL 108  CO2 23  GLUCOSE 121*  BUN 10  CREATININE 0.87  CALCIUM 8.6*  GFRNONAA >60  GFRAA >60  ANIONGAP 9     Hematology Recent Labs  Lab 07/29/18 0305  WBC 10.3  RBC 3.83*  HGB 11.9*  HCT 35.4*  MCV 92.4  MCH 31.1  MCHC 33.6  RDW 13.7  PLT 233    BNPNo results for input(s): BNP, PROBNP in the last 168 hours.   DDimer No results for input(s): DDIMER in the last 168 hours.   Radiology    Dg Chest Port 1 View  Result Date: 07/29/2018 CLINICAL DATA:  Chest pain EXAM: PORTABLE CHEST 1 VIEW COMPARISON:  06/30/2018, 08/15/2011 FINDINGS: The heart size and mediastinal contours are within normal limits. Both lungs are clear. The visualized skeletal structures are unremarkable. IMPRESSION: No active disease. Electronically Signed   By: Jasmine PangKim  Fujinaga M.D.   On: 07/29/2018 03:36    Cardiac Studies   LHC (07/29/2018):  Mid RCA lesion is 25% stenosed.  Mid LAD lesion is 25% stenosed.  The left  ventricular systolic function is normal.  LV [name] diastolic pressure is normal. LVEDP 8 mm Hg.  The left ventricular ejection fraction is 55-65% by visual estimate.  There is no aortic valve stenosis.   Nonobstructive CAD.  Continue preventive therapy.   Patient Profile     53 y.o. female with history of SVT, HTN and tobacco use, admitted with chest pain.  Assessment & Plan    Chest pain: Longstanding and intermittently associated with GI issues.  Pain was responsive to NTG prior to transfer to [facility].  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  Pain is likely non-cardiac, though element of vasospasm and/or microvascular dysfunction cannot be excluded.  Given relief of CP with SL NTG, I will start isosorbide mononitrate 15 mg daily.  Will try to add on lipid panel to previously collected labs.  Begin atorvastatin 20 mg daily to prevent  progression of CAD.  Recommend aspirin 81 mg daily.  Hypertension: BP somewhat labile but overall mildly elevated during admission.  Continue current doses of metoprolol, losartan, and diltiazem.  Add low-dose isosorbide mononitrate, as above.  SVT: No palpitations or arrhythmia noted on tele.  Continue metoprolol and diltiazem.  Tobacco use:  Smoking cessation encouraged.  Disposition: Okay for discharge home today with medications, as outlined above.  Patient should f/u with her primary cardiologist in [facility] in ~2 weeks.  For questions or updates, please contact [facility] Please consult [email or web address] for contact info under     Signed, Nelva Bush, MD  07/30/2018, 7:46 AM

## 2018-07-30 NOTE — Discharge Summary (Addendum)
Discharge Summary    Patient ID: Sarah Larson,  MRN: [id number], DOB/AGE: Sep 09, 1965 53 y.o.  Admit date: 07/29/2018 Discharge date: 07/30/2018  Primary Care Provider: Patient, No Pcp Per Primary Cardiologist: Alfonso Patten, MD  Discharge Diagnoses    Principal Problem:   Chest pain with high risk for cardiac etiology Active Problems:   Hypertension   SVT (supraventricular tachycardia) (HCC)   Tobacco abuse   Allergies Allergies  Allergen Reactions   Metronidazole Itching    Diagnostic Studies/Procedures    Left heart catheterization 07/29/2018:  Mid RCA lesion is 25% stenosed.  Mid LAD lesion is 25% stenosed.  The left ventricular systolic function is normal.  LV Jameia Makris diastolic pressure is normal. LVEDP 8 mm Hg.  The left ventricular ejection fraction is 55-65% by visual estimate.  There is no aortic valve stenosis.   Nonobstructive CAD.  Continue preventive therapy.   _____________   History of Present Illness     Sarah Larson is a 53 year old female with past medical history of hypertension tobacco use, SVT.  She is followed by [facility] cardiology by Dr. Atilano Median as an outpatient.  She denied any family history of CAD.  She previously wore a ZIO patch with noted episode of atrial tachycardia.  Her home medications were switched from amlodipine to Cardizem with improvement.  She reports a six-month history of chest pain.  It was somewhat atypical as they would appear after eating, and other times had no association with rest or activity. She recently underwent a stress test back in January 2020 which noted no notable ischemia.  Appears she has underwent an extensive GI work-up with colonoscopy and EGD.  Colonoscopy noted some polyps which were removed, and EGD with gastric biopsy showed gastritis.  H. pylori test was negative.  Appears that GI was considering doing a HIDA scan to further evaluate her gallbladder. States she was placed on PPI thereafter and  did not experience any relief in symptoms.  Therefore stopped taking.  She was recently seen in the office on 07/08/2018 and symptoms are felt to be atypical in nature.  Decision was made to forego cardiac cath at that time.  Blood pressure was noted to be elevated in the office and she was placed on losartan 25 mg daily in addition to diltiazem and metoprolol.  Notes indicated she had a recent CT chest ordered by her PCP.  In review of this and noted coronary calcifications and aortic atherosclerosis.  Reports she was in her usual state of health while at work yesterday afternoon.  Currently works as a Glass blower/designer.  Tends to lift heavy boxes throughout her shift.  Does not normally associate any anginal symptoms with activity.  Yesterday she experienced centralized chest pressure/heaviness with her work activity.  States she had been picking up boxes.  Symptoms improved when she sat down and rested.  Was able to finish her shift, and went home but symptoms persisted.  Did have some associated shortness of breath.  Symptoms lingered after she arrived home and eventually presented to the [facility] for further evaluation.  In the ED her labs showed electrolytes with the exception of a potassium of 3.2 (supplemented), high-sensitivity troponin 2, hemoglobin 11.9. EKG SR (rate 86) with nonspecific changes in aVL. She was given 3 SL nitro which resolved her symptoms completely. Given concern for ACS she was transferred to [facility] for further work up. No chest pain on arrival.    Hospital Course  Consultants: None   1. Chest pain in patient with coronary artery calcifications on CT Chest: patient presented with chest pain with exertion, improved with rest/ SL nitro. High sensitivity trop was 2 x2. EKG with no acute ischemia. Given risk factors of tobacco abuse/uncontrolled HTN and recent non-ischemic stress test, patient was recommended for LHC to further evaluate chest pain. LHC 07/29/2018  showed non-obstructive CAD with 25% stenosis in mRCA and mLAD with normal LV systolic function and normal LVEDP. She was recommended for aggressive risk factor modifications.  - She was started on aspirin and statin - She was started on imdur 15mg  daily given improvement in chest pain with SL nitro - Continue BP management below  2. HTN: BP somewhat labile but overall mildly elevated this admission. Home medications continued and imdur added for antianginal effects - Continue metoprolol, losartan, and diltiazem - Started on imdur 15mg  daily  3. SVT: No palpitations or arrhythmia noted on telemetry - Continue metoprolol and diltiazem  4. Tobacco abuse: continue to smoke - Continue to encourage smoking cessation    _____________  Discharge Vitals Blood pressure (!) 156/94, pulse 67, temperature 97.7 F (36.5 C), temperature source Oral, resp. rate 19, height 5' 2.5" (1.588 m), weight 63.3 kg, SpO2 99 %.  Filed Weights   07/29/18 0235 07/30/18 0606  Weight: 65.8 kg 63.3 kg    Labs & Radiologic Studies    CBC Recent Labs    07/29/18 0305  WBC 10.3  NEUTROABS 5.1  HGB 11.9*  HCT 35.4*  MCV 92.4  PLT 233   Basic Metabolic Panel Recent Labs    09/81/1905/03/18 0305  NA 140  K 3.2*  CL 108  CO2 23  GLUCOSE 121*  BUN 10  CREATININE 0.87  CALCIUM 8.6*   Liver Function Tests No results for input(s): AST, ALT, ALKPHOS, BILITOT, PROT, ALBUMIN in the last 72 hours. No results for input(s): LIPASE, AMYLASE in the last 72 hours. Cardiac Enzymes No results for input(s): CKTOTAL, CKMB, CKMBINDEX, TROPONINI in the last 72 hours. BNP Invalid input(s): POCBNP D-Dimer No results for input(s): DDIMER in the last 72 hours. Hemoglobin A1C No results for input(s): HGBA1C in the last 72 hours. Fasting Lipid Panel No results for input(s): CHOL, HDL, LDLCALC, TRIG, CHOLHDL, LDLDIRECT in the last 72 hours. Thyroid Function Tests No results for input(s): TSH, T4TOTAL, T3FREE, THYROIDAB in  the last 72 hours.  Invalid input(s): FREET3 _____________  Dg Chest Port 1 View  Result Date: 07/29/2018 CLINICAL DATA:  Chest pain EXAM: PORTABLE CHEST 1 VIEW COMPARISON:  06/30/2018, 08/15/2011 FINDINGS: The heart size and mediastinal contours are within normal limits. Both lungs are clear. The visualized skeletal structures are unremarkable. IMPRESSION: No active disease. Electronically Signed   By: Jasmine PangKim  Fujinaga M.D.   On: 07/29/2018 03:36   Disposition   Patient was seen and examined by Dr. Okey DupreEnd who deemed patient as stable for discharge. Follow-up has been arranged. Discharge medications as listed below.   Follow-up Plans & Appointments    Follow-up Information    Cheek, Osborn CohoHerman Barrett, MD Follow up.   Specialty: Cardiology Why: Please call Dr. Ledell Nossheek's office to schedule an appointment to be seen within 1-2 weeks for close post-hospital follow-up.  Contact information: [address] [place] [id number] [phone]            Discharge Medications   Allergies as of 07/30/2018      Reactions   Metronidazole Itching      Medication List  STOP taking these medications   naproxen 375 MG tablet Commonly known as: NAPROSYN     TAKE these medications   aspirin 81 MG EC tablet Take 1 tablet (81 mg total) by mouth daily.   atorvastatin 20 MG tablet Commonly known as: LIPITOR Take 1 tablet (20 mg total) by mouth daily.   diltiazem 180 MG 24 hr capsule Commonly known as: DILACOR XR Take 180 mg by mouth daily.   isosorbide mononitrate 30 MG 24 hr tablet Commonly known as: IMDUR Take 0.5 tablets (15 mg total) by mouth daily.   losartan 25 MG tablet Commonly known as: COZAAR Take 25 mg by mouth daily.   metoprolol succinate 25 MG 24 hr tablet Commonly known as: TOPROL-XL Take 50 mg by mouth daily.   nitroGLYCERIN 0.4 MG SL tablet Commonly known as: Nitrostat Place 1 tablet (0.4 mg total) under the tongue every 5 (five) minutes as needed for chest  pain.          Outstanding Labs/Studies   Repeat LFTs in 6-8 weeks after starting statin  Duration of Discharge Encounter   Greater than 30 minutes including physician time.  Signed, Beatriz StallionKrista M. Kroeger PA-C 07/30/2018, 8:35 AM   I have independently seen and examined the patient and agree with the findings and plan, as documented in the PA/NP's note.  Please see my complete progress note from earlier today with full details.  Yvonne Kendallhristopher Robet Crutchfield, MD [facility] Pager: [phone]

## 2018-08-02 ENCOUNTER — Encounter (HOSPITAL_COMMUNITY): Payer: Self-pay | Admitting: Interventional Cardiology

## 2018-08-02 MED FILL — Heparin Sod (Porcine)-NaCl IV Soln 1000 Unit/500ML-0.9%: INTRAVENOUS | Qty: 500 | Status: AC

## 2019-08-20 ENCOUNTER — Encounter (HOSPITAL_BASED_OUTPATIENT_CLINIC_OR_DEPARTMENT_OTHER): Payer: Self-pay | Admitting: Emergency Medicine

## 2019-08-20 ENCOUNTER — Other Ambulatory Visit: Payer: Self-pay

## 2019-08-20 ENCOUNTER — Emergency Department (HOSPITAL_BASED_OUTPATIENT_CLINIC_OR_DEPARTMENT_OTHER)
Admission: EM | Admit: 2019-08-20 | Discharge: 2019-08-20 | Disposition: A | Discharge status: Home / Self Care | Payer: Commercial Managed Care - PPO | Attending: Emergency Medicine | Admitting: Emergency Medicine

## 2019-08-20 DIAGNOSIS — I10 Essential (primary) hypertension: Secondary | ICD-10-CM | POA: Diagnosis not present

## 2019-08-20 DIAGNOSIS — F17210 Nicotine dependence, cigarettes, uncomplicated: Secondary | ICD-10-CM | POA: Diagnosis not present

## 2019-08-20 DIAGNOSIS — K05219 Aggressive periodontitis, localized, unspecified severity: Secondary | ICD-10-CM | POA: Insufficient documentation

## 2019-08-20 DIAGNOSIS — Z7982 Long term (current) use of aspirin: Secondary | ICD-10-CM | POA: Insufficient documentation

## 2019-08-20 DIAGNOSIS — Z79899 Other long term (current) drug therapy: Secondary | ICD-10-CM | POA: Insufficient documentation

## 2019-08-20 DIAGNOSIS — K0889 Other specified disorders of teeth and supporting structures: Secondary | ICD-10-CM | POA: Diagnosis present

## 2019-08-20 MED ORDER — NAPROXEN 250 MG PO TABS
375.0000 mg | ORAL_TABLET | Freq: Once | ORAL | Status: AC
  Administered 2019-08-20: 375 mg via ORAL
  Filled 2019-08-20: qty 2

## 2019-08-20 MED ORDER — NAPROXEN 375 MG PO TABS
375.0000 mg | ORAL_TABLET | Freq: Two times a day (BID) | ORAL | 0 refills | Status: DC
Start: 2019-08-20 — End: 2022-02-11

## 2019-08-20 MED ORDER — PENICILLIN V POTASSIUM 500 MG PO TABS
500.0000 mg | ORAL_TABLET | Freq: Three times a day (TID) | ORAL | 0 refills | Status: DC
Start: 2019-08-20 — End: 2022-02-11

## 2019-08-20 MED ORDER — PENICILLIN V POTASSIUM 250 MG PO TABS
500.0000 mg | ORAL_TABLET | Freq: Once | ORAL | Status: AC
  Administered 2019-08-20: 500 mg via ORAL
  Filled 2019-08-20: qty 2

## 2019-08-20 NOTE — ED Provider Notes (Signed)
[facility] EMERGENCY DEPARTMENT Provider Note   CSN: [id number] Arrival date & time: 08/20/19  2021     History Chief Complaint  Patient presents with  . Dental Pain    Sarah Larson is a 54 y.o. female.  HPI   Patient presented to the emergency room for evaluation of swelling and tenderness of her lower gum adjacent to her lateral incisor.  Patient states she has been having some issues with her teeth and has been trying to save up to see a dentist.  Patient noticed the swelling the last day or so.  It is tender to the touch.  She denies any fevers or chills.  No difficulty swallowing.  Past Medical History:  Diagnosis Date  . Atrial tachycardia (HCC)   . GERD (gastroesophageal reflux disease)   . Hypertension   . Hypokalemia   . SVT (supraventricular tachycardia) [facility])     Patient Active Problem List   Diagnosis Date Noted  . Hypertension 07/30/2018  . SVT (supraventricular tachycardia) (HCC) 07/30/2018  . Tobacco abuse 07/30/2018  . Chest pain with high risk for cardiac etiology 07/29/2018    Past Surgical History:  Procedure Laterality Date  . ABDOMINAL HYSTERECTOMY    . APPENDECTOMY    . LEFT HEART CATH AND CORONARY ANGIOGRAPHY N/A 07/29/2018   Procedure: LEFT HEART CATH AND CORONARY ANGIOGRAPHY;  Surgeon: Corky Crafts, MD;  Location: [facility] INVASIVE CV LAB;  Service: Cardiovascular;  Laterality: N/A;     OB History   No obstetric history on file.     Family History  Problem Relation Age of Onset  . Hypertension Father     Social History   Tobacco Use  . Smoking status: Current Every Day Smoker    Types: Cigarettes  . Smokeless tobacco: Never Used  Vaping Use  . Vaping Use: Never used  Substance Use Topics  . Alcohol use: Yes    Comment: occasionally  . Drug use: No    Home Medications Prior to Admission medications   Medication Sig Start Date End Date Taking? Authorizing Provider  aspirin EC 81 MG EC tablet Take 1 tablet (81  mg total) by mouth daily. 07/30/18   Kroeger, Ovidio Kin., PA-C  atorvastatin (LIPITOR) 20 MG tablet Take 1 tablet (20 mg total) by mouth daily. 07/30/18   Kroeger, Ovidio Kin., PA-C  diltiazem (DILACOR XR) 180 MG 24 hr capsule Take 180 mg by mouth daily.    [provider]  isosorbide mononitrate (IMDUR) 30 MG 24 hr tablet Take 0.5 tablets (15 mg total) by mouth daily. 07/30/18   Kroeger, Ovidio Kin., PA-C  losartan (COZAAR) 25 MG tablet Take 25 mg by mouth daily.    [provider]  metoprolol succinate (TOPROL-XL) 25 MG 24 hr tablet Take 50 mg by mouth daily.    [provider]  naproxen (NAPROSYN) 375 MG tablet Take 1 tablet (375 mg total) by mouth 2 (two) times daily. 08/20/19   Linwood Dibbles, MD  nitroGLYCERIN (NITROSTAT) 0.4 MG SL tablet Place 1 tablet (0.4 mg total) under the tongue every 5 (five) minutes as needed for chest pain. 07/30/18 07/30/19  Kroeger, Ovidio Kin., PA-C  penicillin v potassium (VEETID) 500 MG tablet Take 1 tablet (500 mg total) by mouth 3 (three) times daily. 08/20/19   Linwood Dibbles, MD    Allergies    Metronidazole  Review of Systems   Review of Systems  All other systems reviewed and are negative.   Physical Exam Updated  Vital Signs BP (!) 138/97 (BP Location: Left Arm)   Pulse 77   Temp 99 F (37.2 C) (Oral)   Resp 18   Ht 1.613 m (5' 3.5")   Wt 63.3 kg   SpO2 100%   BMI 24.33 kg/m   Physical Exam Vitals and nursing note reviewed.  Constitutional:      General: She is not in acute distress.    Appearance: She is well-developed.  HENT:     Head: Normocephalic and atraumatic.     Right Ear: External ear normal.     Left Ear: External ear normal.     Mouth/Throat:     Comments: No oropharyngeal swelling, dental caries noted, induration and tenderness of the gingival area adjacent to her left lower lateral incisor, no fluctuant area amenable to I&D Eyes:     General: No scleral icterus.       Right eye: No discharge.        Left eye: No  discharge.     Conjunctiva/sclera: Conjunctivae normal.  Neck:     Trachea: No tracheal deviation.  Cardiovascular:     Rate and Rhythm: Normal rate.  Pulmonary:     Effort: Pulmonary effort is normal. No respiratory distress.     Breath sounds: No stridor.  Abdominal:     General: There is no distension.  Musculoskeletal:        General: No swelling or deformity.     Cervical back: Neck supple.  Skin:    General: Skin is warm and dry.     Findings: No rash.  Neurological:     Mental Status: She is alert.     Cranial Nerves: Cranial nerve deficit: no gross deficits.     ED Results / Procedures / Treatments   Labs (all labs ordered are listed, but only abnormal results are displayed) Labs Reviewed - No data to display  EKG None  Radiology No results found.  Procedures Procedures (including critical care time)  Medications Ordered in ED Medications  penicillin v potassium (VEETID) tablet 500 mg (has no administration in time range)  naproxen (NAPROSYN) tablet 375 mg (has no administration in time range)    ED Course  I have reviewed the triage vital signs and the nursing notes.  Pertinent labs & imaging results that were available during my care of the patient were reviewed by me and considered in my medical decision making (see chart for details).    MDM Rules/Calculators/A&P                          Patient appears to have dental caries and probable periodontal abscess.  Does not appear large enough to incise in the ED.  I will start the patient on antibiotics.  I will give her a referral to the dentist on call Final Clinical Impression(s) / ED Diagnoses Final diagnoses:  Periodontal abscess    Rx / DC Orders ED Discharge Orders         Ordered    penicillin v potassium (VEETID) 500 MG tablet  3 times daily     Discontinue  Reprint     08/20/19 2124    naproxen (NAPROSYN) 375 MG tablet  2 times daily     Discontinue  Reprint     08/20/19 2124             Linwood Dibbles, MD 08/21/19 [phone]

## 2019-08-20 NOTE — Discharge Instructions (Addendum)
You can also take over the counter medications such as anbesol.  Follow up with the dentist.  Call them and let them know you were referred from the ED.

## 2019-08-20 NOTE — ED Triage Notes (Signed)
Patient states that she started to have pain to her left lower gum last night. She now has noted swelling to the left lower portion of her gum

## 2020-07-13 ENCOUNTER — Emergency Department (HOSPITAL_BASED_OUTPATIENT_CLINIC_OR_DEPARTMENT_OTHER): Payer: Commercial Managed Care - PPO

## 2020-07-13 ENCOUNTER — Encounter (HOSPITAL_BASED_OUTPATIENT_CLINIC_OR_DEPARTMENT_OTHER): Payer: Self-pay

## 2020-07-13 ENCOUNTER — Other Ambulatory Visit: Payer: Self-pay

## 2020-07-13 ENCOUNTER — Emergency Department (HOSPITAL_BASED_OUTPATIENT_CLINIC_OR_DEPARTMENT_OTHER)
Admission: EM | Admit: 2020-07-13 | Discharge: 2020-07-13 | Disposition: A | Discharge status: Home / Self Care | Payer: Commercial Managed Care - PPO | Attending: Emergency Medicine | Admitting: Emergency Medicine

## 2020-07-13 DIAGNOSIS — I10 Essential (primary) hypertension: Secondary | ICD-10-CM | POA: Insufficient documentation

## 2020-07-13 DIAGNOSIS — Z79899 Other long term (current) drug therapy: Secondary | ICD-10-CM | POA: Diagnosis not present

## 2020-07-13 DIAGNOSIS — R0789 Other chest pain: Secondary | ICD-10-CM | POA: Diagnosis present

## 2020-07-13 DIAGNOSIS — R079 Chest pain, unspecified: Secondary | ICD-10-CM

## 2020-07-13 DIAGNOSIS — F17210 Nicotine dependence, cigarettes, uncomplicated: Secondary | ICD-10-CM | POA: Insufficient documentation

## 2020-07-13 DIAGNOSIS — Z955 Presence of coronary angioplasty implant and graft: Secondary | ICD-10-CM | POA: Diagnosis not present

## 2020-07-13 DIAGNOSIS — Z7982 Long term (current) use of aspirin: Secondary | ICD-10-CM | POA: Diagnosis not present

## 2020-07-13 DIAGNOSIS — I2510 Atherosclerotic heart disease of native coronary artery without angina pectoris: Secondary | ICD-10-CM | POA: Insufficient documentation

## 2020-07-13 LAB — COMPREHENSIVE METABOLIC PANEL
ALT: 13 U/L (ref 0–44)
AST: 18 U/L (ref 15–41)
Albumin: 3.9 g/dL (ref 3.5–5.0)
Alkaline Phosphatase: 73 U/L (ref 38–126)
Anion gap: 7 (ref 5–15)
BUN: 18 mg/dL (ref 6–20)
CO2: 25 mmol/L (ref 22–32)
Calcium: 9 mg/dL (ref 8.9–10.3)
Chloride: 106 mmol/L (ref 98–111)
Creatinine, Ser: 0.88 mg/dL (ref 0.44–1.00)
GFR, Estimated: >60 mL/min (ref >60)
Glucose, Bld: 98 mg/dL (ref 70–99)
Potassium: 3.1 mmol/L — ABNORMAL LOW (ref 3.5–5.1)
Sodium: 138 mmol/L (ref 135–145)
Total Bilirubin: 0.1 mg/dL — ABNORMAL LOW (ref 0.3–1.2)
Total Protein: 7.2 g/dL (ref 6.5–8.1)

## 2020-07-13 LAB — CBC
HCT: 35.5 % — ABNORMAL LOW (ref 36.0–46.0)
Hemoglobin: 12.1 g/dL (ref 12.0–15.0)
MCH: 30.6 pg (ref 26.0–34.0)
MCHC: 34.1 g/dL (ref 30.0–36.0)
MCV: 89.6 fL (ref 80.0–100.0)
Platelets: 264 10*3/uL (ref 150–400)
RBC: 3.96 MIL/uL (ref 3.87–5.11)
RDW: 14 % (ref 11.5–15.5)
WBC: 11.1 10*3/uL — ABNORMAL HIGH (ref 4.0–10.5)
nRBC: 0 % (ref 0.0–0.2)

## 2020-07-13 LAB — TROPONIN I (HIGH SENSITIVITY)
Troponin I (High Sensitivity): 3 ng/L (ref <18)
Troponin I (High Sensitivity): 2 ng/L (ref <18)

## 2020-07-13 LAB — LIPASE, BLOOD: Lipase: 30 U/L (ref 11–51)

## 2020-07-13 NOTE — Discharge Instructions (Addendum)
Call your primary care doctor or specialist as discussed in the next 2-3 days.   Return immediately back to the ER if:  Your symptoms worsen within the next 12-24 hours. You develop new symptoms such as new fevers, persistent vomiting, new pain, shortness of breath, or new weakness or numbness, or if you have any other concerns.  

## 2020-07-13 NOTE — ED Provider Notes (Signed)
[facility] EMERGENCY DEPARTMENT Provider Note   CSN: [id number] Arrival date & time: 07/13/20  1051     History Chief Complaint  Patient presents with   Chest Pain    Sarah Larson is a 55 y.o. female.  Patient presents ER chief complaint of chest tightness sensation.  She states this been going off and on for the past 2 weeks.  Generally there every day most of the day but sometimes she can sense that it is gone.  She had another episode last night and this morning describes it as a pressure sensation mid chest nonradiating similar to prior episodes this past week and presents to the ER.  Nothing seems to make it worse or bring it on.  Does not appear activity related per patient.  Denies fevers cough vomiting or diarrhea.      Past Medical History:  Diagnosis Date   Atrial tachycardia (HCC)    GERD (gastroesophageal reflux disease)    Hypertension    Hypokalemia    SVT (supraventricular tachycardia) (HCC)     Patient Active Problem List   Diagnosis Date Noted   Hypertension 07/30/2018   SVT (supraventricular tachycardia) (HCC) 07/30/2018   Tobacco abuse 07/30/2018   Chest pain with high risk for cardiac etiology 07/29/2018    Past Surgical History:  Procedure Laterality Date   ABDOMINAL HYSTERECTOMY     APPENDECTOMY     EYE SURGERY     LEFT HEART CATH AND CORONARY ANGIOGRAPHY N/A 07/29/2018   Procedure: LEFT HEART CATH AND CORONARY ANGIOGRAPHY;  Surgeon: Corky Crafts, MD;  Location: [facility] INVASIVE CV LAB;  Service: Cardiovascular;  Laterality: N/A;     OB History   No obstetric history on file.     Family History  Problem Relation Age of Onset   Hypertension Father     Social History   Tobacco Use   Smoking status: Every Day    Pack years: 0.00    Types: Cigarettes   Smokeless tobacco: Never  Vaping Use   Vaping Use: Never used  Substance Use Topics   Alcohol use: Yes    Comment: occasionally   Drug use: No    Home  Medications Prior to Admission medications   Medication Sig Start Date End Date Taking? Authorizing Provider  aspirin EC 81 MG EC tablet Take 1 tablet (81 mg total) by mouth daily. 07/30/18   Kroeger, Ovidio Kin., PA-C  atorvastatin (LIPITOR) 20 MG tablet Take 1 tablet (20 mg total) by mouth daily. 07/30/18   Kroeger, Ovidio Kin., PA-C  diltiazem (DILACOR XR) 180 MG 24 hr capsule Take 180 mg by mouth daily.    [provider]  isosorbide mononitrate (IMDUR) 30 MG 24 hr tablet Take 0.5 tablets (15 mg total) by mouth daily. 07/30/18   Kroeger, Ovidio Kin., PA-C  losartan (COZAAR) 25 MG tablet Take 25 mg by mouth daily.    [provider]  metoprolol succinate (TOPROL-XL) 25 MG 24 hr tablet Take 50 mg by mouth daily.    [provider]  naproxen (NAPROSYN) 375 MG tablet Take 1 tablet (375 mg total) by mouth 2 (two) times daily. 08/20/19   Linwood Dibbles, MD  nitroGLYCERIN (NITROSTAT) 0.4 MG SL tablet Place 1 tablet (0.4 mg total) under the tongue every 5 (five) minutes as needed for chest pain. 07/30/18 07/30/19  Kroeger, Ovidio Kin., PA-C  penicillin v potassium (VEETID) 500 MG tablet Take 1 tablet (500 mg total) by mouth 3 (three) times daily.  08/20/19   Linwood Dibbles, MD    Allergies    Metronidazole  Review of Systems   Review of Systems  Constitutional:  Negative for fever.  HENT:  Negative for ear pain.   Eyes:  Negative for pain.  Respiratory:  Negative for cough.   Cardiovascular:  Positive for chest pain.  Gastrointestinal:  Negative for abdominal pain.  Genitourinary:  Negative for flank pain.  Musculoskeletal:  Negative for back pain.  Skin:  Negative for rash.  Neurological:  Negative for headaches.   Physical Exam Updated Vital Signs BP (!) 144/125   Pulse (!) 57   Temp 98.3 F (36.8 C) (Oral)   Resp (!) 21   Ht 5\' 3"  (1.6 m)   Wt 72.6 kg   SpO2 99%   BMI 28.34 kg/m   Physical Exam Constitutional:      General: She is not in acute distress.    Appearance:  Normal appearance.  HENT:     Head: Normocephalic.     Nose: Nose normal.  Eyes:     Extraocular Movements: Extraocular movements intact.  Cardiovascular:     Rate and Rhythm: Normal rate.  Pulmonary:     Effort: Pulmonary effort is normal.  Musculoskeletal:        General: Normal range of motion.     Cervical back: Normal range of motion.     Right lower leg: No edema.     Left lower leg: No edema.  Neurological:     General: No focal deficit present.     Mental Status: She is alert. Mental status is at baseline.    ED Results / Procedures / Treatments   Labs (all labs ordered are listed, but only abnormal results are displayed) Labs Reviewed  CBC - Abnormal; Notable for the following components:      Result Value   WBC 11.1 (*)    HCT 35.5 (*)    All other components within normal limits  COMPREHENSIVE METABOLIC PANEL - Abnormal; Notable for the following components:   Potassium 3.1 (*)    Total Bilirubin 0.1 (*)    All other components within normal limits  LIPASE, BLOOD  TROPONIN I (HIGH SENSITIVITY)  TROPONIN I (HIGH SENSITIVITY)    EKG EKG Interpretation  Date/Time:  Friday July 13 2020 11:04:42 EDT Ventricular Rate:  70 PR Interval:  174 QRS Duration: 91 QT Interval:  397 QTC Calculation: 429 R Axis:   9 Text Interpretation: Sinus rhythm Confirmed by [id number] ([id number]) on 07/13/2020 11:13:20 AM  Radiology DG Chest Port 1 View  Result Date: 07/13/2020 CLINICAL DATA:  Chest pain EXAM: PORTABLE CHEST 1 VIEW COMPARISON:  07/29/2018 FINDINGS: The heart size and mediastinal contours are within normal limits. Both lungs are clear. The visualized skeletal structures are unremarkable. IMPRESSION: No acute abnormality of the lungs in AP portable projection. Electronically Signed   By: 09/29/2018 M.D.   On: 07/13/2020 11:38   DG Toe 5th Left  Result Date: 07/13/2020 CLINICAL DATA:  Pain and discoloration to fifth digit. EXAM: DG TOE 5TH LEFT COMPARISON:  None.  FINDINGS: Soft tissue swelling is noted along the lateral aspect of the foot extending into the fifth ray. No fracture or dislocation identified. No focal bone erosion or signs of arthropathy. IMPRESSION: 1. No acute bone abnormality. 2. Soft tissue swelling along the lateral aspect of the foot extending into the fifth ray. Electronically Signed   By: 07/15/2020 M.D.   On: 07/13/2020 12:42  Procedures Procedures   Medications Ordered in ED Medications - No data to display  ED Course  I have reviewed the triage vital signs and the nursing notes.  Pertinent labs & imaging results that were available during my care of the patient were reviewed by me and considered in my medical decision making (see chart for details).    MDM Rules/Calculators/A&P                          EKG is unremarkable sinus rhythm no ST elevations or depressions, no T wave inversions noted.  Labs unremarkable as well initial troponin is negative and second troponin continues to be negative as well.  Patient does have a history of coronary artery disease, she has a cardiologist with whom she follows.  Advised her to call her cardiologist this week and to follow-up this week.  Advising immediate return for worsening chest pain difficulty breathing or any additional concerns to return immediately back to the ER.   Final Clinical Impression(s) / ED Diagnoses Final diagnoses:  Chest pain, unspecified type    Rx / DC Orders ED Discharge Orders     None        Cheryll Cockayne, MD 07/13/20 1356

## 2020-07-13 NOTE — ED Triage Notes (Addendum)
Pt with multiple complaints.   1) Pt concerned about a mouth lesion and pain that is gradually getting worse.   2) Pt states she has been having bloating and epigastric pain. The pain radiates to the chest with some associated ShOB and nausea. Pt with cardiac history.   3) Pt c/o pain and discoloration to L little toe (5th digit).

## 2021-02-28 ENCOUNTER — Emergency Department (HOSPITAL_BASED_OUTPATIENT_CLINIC_OR_DEPARTMENT_OTHER)
Admission: EM | Admit: 2021-02-28 | Discharge: 2021-03-01 | Disposition: A | Discharge status: Home / Self Care | Payer: PRIVATE HEALTH INSURANCE | Attending: Emergency Medicine | Admitting: Emergency Medicine

## 2021-02-28 ENCOUNTER — Other Ambulatory Visit: Payer: Self-pay

## 2021-02-28 ENCOUNTER — Emergency Department (HOSPITAL_BASED_OUTPATIENT_CLINIC_OR_DEPARTMENT_OTHER): Payer: PRIVATE HEALTH INSURANCE

## 2021-02-28 ENCOUNTER — Encounter (HOSPITAL_BASED_OUTPATIENT_CLINIC_OR_DEPARTMENT_OTHER): Payer: Self-pay | Admitting: *Deleted

## 2021-02-28 DIAGNOSIS — R42 Dizziness and giddiness: Secondary | ICD-10-CM | POA: Diagnosis present

## 2021-02-28 DIAGNOSIS — I951 Orthostatic hypotension: Secondary | ICD-10-CM | POA: Insufficient documentation

## 2021-02-28 DIAGNOSIS — Z7982 Long term (current) use of aspirin: Secondary | ICD-10-CM | POA: Insufficient documentation

## 2021-02-28 DIAGNOSIS — E876 Hypokalemia: Secondary | ICD-10-CM | POA: Insufficient documentation

## 2021-02-28 DIAGNOSIS — J3489 Other specified disorders of nose and nasal sinuses: Secondary | ICD-10-CM | POA: Diagnosis not present

## 2021-02-28 DIAGNOSIS — Z20822 Contact with and (suspected) exposure to covid-19: Secondary | ICD-10-CM | POA: Insufficient documentation

## 2021-02-28 DIAGNOSIS — E860 Dehydration: Secondary | ICD-10-CM | POA: Insufficient documentation

## 2021-02-28 DIAGNOSIS — R109 Unspecified abdominal pain: Secondary | ICD-10-CM | POA: Insufficient documentation

## 2021-02-28 LAB — CBC WITH DIFFERENTIAL/PLATELET
Abs Immature Granulocytes: 0.02 10*3/uL (ref 0.00–0.07)
Basophils Absolute: 0 10*3/uL (ref 0.0–0.1)
Basophils Relative: 0 %
Eosinophils Absolute: 0.1 10*3/uL (ref 0.0–0.5)
Eosinophils Relative: 1 %
HCT: 38.5 % (ref 36.0–46.0)
Hemoglobin: 13.3 g/dL (ref 12.0–15.0)
Immature Granulocytes: 0 %
Lymphocytes Relative: 37 %
Lymphs Abs: 3.2 10*3/uL (ref 0.7–4.0)
MCH: 30.6 pg (ref 26.0–34.0)
MCHC: 34.5 g/dL (ref 30.0–36.0)
MCV: 88.7 fL (ref 80.0–100.0)
Monocytes Absolute: 0.6 10*3/uL (ref 0.1–1.0)
Monocytes Relative: 7 %
Neutro Abs: 4.8 10*3/uL (ref 1.7–7.7)
Neutrophils Relative %: 55 %
Platelets: 274 10*3/uL (ref 150–400)
RBC: 4.34 MIL/uL (ref 3.87–5.11)
RDW: 13.6 % (ref 11.5–15.5)
WBC: 8.7 10*3/uL (ref 4.0–10.5)
nRBC: 0 % (ref 0.0–0.2)

## 2021-02-28 LAB — URINALYSIS, ROUTINE W REFLEX MICROSCOPIC
Bilirubin Urine: NEGATIVE
Glucose, UA: NEGATIVE mg/dL
Ketones, ur: NEGATIVE mg/dL
Leukocytes,Ua: NEGATIVE
Nitrite: NEGATIVE
Protein, ur: NEGATIVE mg/dL
Specific Gravity, Urine: 1.01 (ref 1.005–1.030)
pH: 5.5 (ref 5.0–8.0)

## 2021-02-28 LAB — URINALYSIS, MICROSCOPIC (REFLEX): WBC, UA: NONE SEEN WBC/hpf (ref 0–5)

## 2021-02-28 LAB — COMPREHENSIVE METABOLIC PANEL
ALT: 14 U/L (ref 0–44)
AST: 21 U/L (ref 15–41)
Albumin: 4.2 g/dL (ref 3.5–5.0)
Alkaline Phosphatase: 75 U/L (ref 38–126)
Anion gap: 11 (ref 5–15)
BUN: 10 mg/dL (ref 6–20)
CO2: 25 mmol/L (ref 22–32)
Calcium: 9.2 mg/dL (ref 8.9–10.3)
Chloride: 102 mmol/L (ref 98–111)
Creatinine, Ser: 0.82 mg/dL (ref 0.44–1.00)
GFR, Estimated: >60 mL/min (ref >60)
Glucose, Bld: 89 mg/dL (ref 70–99)
Potassium: 3 mmol/L — ABNORMAL LOW (ref 3.5–5.1)
Sodium: 138 mmol/L (ref 135–145)
Total Bilirubin: 0.6 mg/dL (ref 0.3–1.2)
Total Protein: 7.7 g/dL (ref 6.5–8.1)

## 2021-02-28 LAB — RESP PANEL BY RT-PCR (FLU A&B, COVID) ARPGX2
Influenza A by PCR: NEGATIVE
Influenza B by PCR: NEGATIVE
SARS Coronavirus 2 by RT PCR: NEGATIVE

## 2021-02-28 LAB — TROPONIN I (HIGH SENSITIVITY): Troponin I (High Sensitivity): 3 ng/L (ref <18)

## 2021-02-28 MED ORDER — POTASSIUM CHLORIDE ER 10 MEQ PO TBCR: 10.0000 meq | EXTENDED_RELEASE_TABLET | Freq: Every day | ORAL | 0 refills | Status: AC

## 2021-02-28 MED ORDER — POTASSIUM CHLORIDE CRYS ER 20 MEQ PO TBCR
40.0000 meq | EXTENDED_RELEASE_TABLET | Freq: Once | ORAL | Status: AC
  Administered 2021-02-28: 40 meq via ORAL
  Filled 2021-02-28: qty 2

## 2021-02-28 MED ORDER — SODIUM CHLORIDE 0.9 % IV BOLUS
1000.0000 mL | Freq: Once | INTRAVENOUS | Status: AC
  Administered 2021-02-28: 1000 mL via INTRAVENOUS

## 2021-02-28 MED ORDER — FLUTICASONE PROPIONATE 50 MCG/ACT NA SUSP: 1.0000 | Freq: Every day | NASAL | 0 refills | Status: AC

## 2021-02-28 NOTE — ED Provider Notes (Signed)
[facility] EMERGENCY DEPARTMENT Provider Note   CSN: [id number] Arrival date & time: 02/28/21  1849     History  Chief Complaint  Patient presents with   Dizziness    Sarah Larson is a 56 y.o. female who presents to the ED today with complaint of gradual onset, constant, nausea for the past 3 days. She also complains of feeling a "wavelike" sensation in her head. She tells me this has been ongoing for about 2 weeks now. She states that she saw her PCP this past week for same as well as addressing ongoing abdominal pain. She was prescribed Prilosec in place of Protonix with addition of pepcid as needed with plans to follow up with GI. She had labs collected for her other vague symptoms and was advised she may need to follow up with neurology in the future however no concerning symptoms at that time and neuro exam reassuring. Pt does wear prescription glasses however has not had these changed in about 10 years time and was recommended to have this rechecked with optometry/ophtho as well. Pt states she continues to feel nauseated prompting ED visit. She states she has googled her symptoms and is concerned she is "septic." This same concern was brought up at PCP's office. Pt denies any fevers recently and is afebrile in the ED at 98.5. She denies any specific chest pain or SOB. No specific headache.   The history is provided by the patient and medical records.      Home Medications Prior to Admission medications   Medication Sig Start Date End Date Taking? Authorizing Provider  fluticasone (FLONASE) 50 MCG/ACT nasal spray Place 1 spray into both nostrils daily. 02/28/21  Yes Alroy Bailiff, Kahlia Lagunes, PA-C  omeprazole (PRILOSEC) 20 MG capsule Take by mouth. 02/21/21  Yes [provider]  potassium chloride (KLOR-CON) 10 MEQ tablet Take 1 tablet (10 mEq total) by mouth daily for 5 days. 02/28/21 03/05/21 Yes Khalidah Herbold, PA-C  aspirin EC 81 MG EC tablet Take 1 tablet (81 mg total) by  mouth daily. 07/30/18   Kroeger, Lorelee Cover., PA-C  atorvastatin (LIPITOR) 20 MG tablet Take 1 tablet (20 mg total) by mouth daily. 07/30/18   Kroeger, Lorelee Cover., PA-C  diltiazem (DILACOR XR) 180 MG 24 hr capsule Take 180 mg by mouth daily.    [provider]  isosorbide mononitrate (IMDUR) 30 MG 24 hr tablet Take 0.5 tablets (15 mg total) by mouth daily. 07/30/18   Kroeger, Lorelee Cover., PA-C  losartan (COZAAR) 25 MG tablet Take 25 mg by mouth daily.    [provider]  metoprolol succinate (TOPROL-XL) 25 MG 24 hr tablet Take 50 mg by mouth daily.    [provider]  naproxen (NAPROSYN) 375 MG tablet Take 1 tablet (375 mg total) by mouth 2 (two) times daily. 08/20/19   Dorie Rank, MD  nitroGLYCERIN (NITROSTAT) 0.4 MG SL tablet Place 1 tablet (0.4 mg total) under the tongue every 5 (five) minutes as needed for chest pain. 07/30/18 07/30/19  Kroeger, Lorelee Cover., PA-C  penicillin v potassium (VEETID) 500 MG tablet Take 1 tablet (500 mg total) by mouth 3 (three) times daily. 08/20/19   Dorie Rank, MD      Allergies    Metronidazole    Review of Systems   Review of Systems  Constitutional:  Negative for chills and fever.  Eyes:  Negative for visual disturbance.  Respiratory:  Negative for shortness of breath.   Cardiovascular:  Negative for chest pain.  Gastrointestinal:  Positive for abdominal pain (chronic) and nausea. Negative for vomiting.  Neurological:  Positive for dizziness and light-headedness. Negative for syncope and headaches.  All other systems reviewed and are negative.  Physical Exam Updated Vital Signs BP (!) 152/91    Pulse 69    Temp 98.5 F (36.9 C) (Oral)    Resp (!) 22    Ht 5\' 3"  (1.6 m)    Wt 69.9 kg    SpO2 100%    BMI 27.28 kg/m  Physical Exam Vitals and nursing note reviewed.  Constitutional:      Appearance: She is not ill-appearing or diaphoretic.  HENT:     Head: Normocephalic and atraumatic.     Nose:     Right Sinus: No maxillary sinus  tenderness or frontal sinus tenderness.     Left Sinus: No maxillary sinus tenderness or frontal sinus tenderness.  Eyes:     Extraocular Movements: Extraocular movements intact.     Conjunctiva/sclera: Conjunctivae normal.     Pupils: Pupils are equal, round, and reactive to light.     Comments: No nystagmus  Cardiovascular:     Rate and Rhythm: Normal rate and regular rhythm.     Pulses: Normal pulses.  Pulmonary:     Effort: Pulmonary effort is normal.     Breath sounds: Normal breath sounds. No wheezing, rhonchi or rales.  Abdominal:     Palpations: Abdomen is soft.     Tenderness: There is no abdominal tenderness. There is no guarding or rebound.  Musculoskeletal:     Cervical back: Neck supple.  Skin:    General: Skin is warm and dry.  Neurological:     Mental Status: She is alert.     Comments: Alert and oriented to self, place, time and event.   Speech is fluent, clear without dysarthria or dysphasia.   Strength 5/5 in upper/lower extremities   Sensation intact in upper/lower extremities   Normal gait.  Negative Romberg. No pronator drift.  Normal finger-to-nose and feet tapping.  CN I not tested  CN II grossly intact visual fields bilaterally. Did not visualize posterior eye.  CN III, IV, VI PERRLA and EOMs intact bilaterally  CN V Intact sensation to sharp and light touch to the face  CN VII facial movements symmetric  CN VIII not tested  CN IX, X no uvula deviation, symmetric rise of soft palate  CN XI 5/5 SCM and trapezius strength bilaterally  CN XII Midline tongue protrusion, symmetric L/R movements      ED Results / Procedures / Treatments   Labs (all labs ordered are listed, but only abnormal results are displayed) Labs Reviewed  COMPREHENSIVE METABOLIC PANEL - Abnormal; Notable for the following components:      Result Value   Potassium 3.0 (*)    All other components within normal limits  URINALYSIS, ROUTINE W REFLEX MICROSCOPIC - Abnormal;  Notable for the following components:   Hgb urine dipstick TRACE (*)    All other components within normal limits  URINALYSIS, MICROSCOPIC (REFLEX) - Abnormal; Notable for the following components:   Bacteria, UA RARE (*)    All other components within normal limits  RESP PANEL BY RT-PCR (FLU A&B, COVID) [id number]  CBC WITH DIFFERENTIAL/PLATELET  TROPONIN I (HIGH SENSITIVITY)    EKG None  Radiology CT Head Wo Contrast  Result Date: 02/28/2021 CLINICAL DATA:  Dizziness. Additional history provided: Lightheadedness, nausea. EXAM: CT HEAD WITHOUT CONTRAST TECHNIQUE: Contiguous axial images were obtained from  the base of the skull through the vertex without intravenous contrast. RADIATION DOSE REDUCTION: This exam was performed according to the departmental dose-optimization program which includes automated exposure control, adjustment of the mA and/or kV according to patient size and/or use of iterative reconstruction technique. COMPARISON:  Prior head CT 10/11/2016. FINDINGS: Brain: Cerebral volume is normal. There is no acute intracranial hemorrhage. No demarcated cortical infarct. No extra-axial fluid collection. No evidence of an intracranial mass. No midline shift. Vascular: No hyperdense vessel. Atherosclerotic calcifications. Skull: Normal. Negative for fracture or focal lesion. Sinuses/Orbits: Visualized orbits show no acute finding. Mild mucosal thickening within the left frontal and bilateral ethmoid sinuses. Mild mucosal thickening within the bilateral maxillary sinuses at the imaged levels. IMPRESSION: No evidence of acute intracranial abnormality. Mild mucosal thickening within the paranasal sinuses at the imaged levels. Electronically Signed   By: Kellie Simmering D.O.   On: 02/28/2021 20:47    Procedures Procedures    Medications Ordered in ED Medications  potassium chloride SA (KLOR-CON M) CR tablet 40 mEq (has no administration in time range)  sodium chloride 0.9 % bolus 1,000 mL (0  mLs Intravenous Stopped 02/28/21 2347)    ED Course/ Medical Decision Making/ A&P                           Medical Decision Making 56 year old female who presents to the ED today with complaint of nausea and a wave like sensation in her head for approximately 2 weeks.  Recent PCP visit for same with lab work pending.  Concern for possible orthostatics versus dehydration versus need for additional eye exam as she has not had her eye prescription changed in 10 years versus other neurological complaint however no focal neurodeficits at PCPs office.  Continues to have symptoms prompting ED visit today.  On arrival to the ED vitals are stable.  Patient appears to be in no acute distress.  She is no focal neurodeficits on exam today.  She denies any specific headache, vision changes, vomiting, confusion, unilateral weakness or numbness.  She does not describe her symptoms as room spinning dizziness or feeling off balance herself.  Does report it seems worse with positioning however. Does seem more presyncopal?  Her PCP note stated this has been ongoing her whole life however patient tells me only 2 weeks.  Will work-up for questionable dizziness versus lightheadedness at this time with lab work including EKG, troponin, CBC, CMP.  Also add on CT head for further evaluation.   Orthostatics: Orthostatic Lying  BP- Lying: 131/85 Pulse- Lying: 65 Orthostatic Sitting BP- Sitting: 132/87 Pulse- Sitting: 77 Orthostatic Standing at 0 minutes BP- Standing at 0 minutes: 115/87 Pulse- Standing at 0 minutes: 85  Pt provided with fluids for orthostasis. Remainder of workup reassuring. CT head does show findings of mild mucosal thickening in paranasal sinuses. Question if this could be causing symptoms. No signs of sinusitis on exam today. Have recommended flonase daily. On reevaluation she is resting comfortably. Do not feel she requires additional emergent workup at this time. She is recommended to follow up with PCP  for further eval. She is in agreement with plan and stable for discharge home.   Problems Addressed: Dehydration: acute illness or injury    Details: Fluids provided Hypokalemia: acute illness or injury    Details: KDUR provided in the ED and discharged home with same. Pt to have potassim rechecked in 1-2 weeks. Orthostatic hypotension: acute illness or injury  Details: Treated with IVF. Encouraged to increase oral intake at home. Sinus mucosal thickening: acute illness or injury    Details: D/C with flonase Rx.  Amount and/or Complexity of Data Reviewed Labs: ordered.    Details: CBC without leukocytosis. Hgb stable at 13.3. CMP with potassium 3.0; will replete in the ED and discharge home with same. Pt will need to have potassium level rechecked by PCP. No other electrolyte abnormalities. Creatinine stable at 0.82.  Troponin of 3. Do not feel pt requires repeat as sx do not seem consistent with ACS and have been ongoing for > 1 day U/A with trace hgb on dipstick. No infection appreciated.  COVID and flu negative. Radiology: ordered.    Details: CT head: IMPRESSION:  No evidence of acute intracranial abnormality.   Mild mucosal thickening within the paranasal sinuses at the imaged  levels.          Final Clinical Impression(s) / ED Diagnoses Final diagnoses:  Hypokalemia  Sinus mucosal thickening  Orthostatic hypotension  Dehydration    Rx / DC Orders ED Discharge Orders          Ordered    potassium chloride (KLOR-CON) 10 MEQ tablet  Daily        02/28/21 2349    fluticasone (FLONASE) 50 MCG/ACT nasal spray  Daily        02/28/21 2350             Discharge Instructions      Please follow up with your PCP for further evaluation of your ongoing symptoms  Pick up medications and take as prescribed. Your potassium was slightly decreased in the ED today. You will need to have your potassium level rechecked in 1-2 weeks by your PCP.   Please also  increase the amount of fluids you drink on a daily basis as you appeared dehydrated on exam today. This may be contributing to your symptoms.   Use the flonase as directed as you were noted to have some mucosal thickening of your sinuses  Return to the ED for any new/worsening symptoms          Eustaquio Maize, PA-C 02/28/21 2353    Varney Biles, MD 03/02/21 1422

## 2021-02-28 NOTE — ED Triage Notes (Addendum)
Lightheaded before going to work today. Nausea x 3 days. She drove here. She saw her MD last week for same and was given acid reducer. No relief with the medication. EKG at triage.

## 2021-02-28 NOTE — Discharge Instructions (Addendum)
Please follow up with your PCP for further evaluation of your ongoing symptoms  Pick up medications and take as prescribed. Your potassium was slightly decreased in the ED today. You will need to have your potassium level rechecked in 1-2 weeks by your PCP.   Please also increase the amount of fluids you drink on a daily basis as you appeared dehydrated on exam today. This may be contributing to your symptoms.   Use the flonase as directed as you were noted to have some mucosal thickening of your sinuses  Return to the ED for any new/worsening symptoms

## 2021-04-17 ENCOUNTER — Emergency Department (HOSPITAL_BASED_OUTPATIENT_CLINIC_OR_DEPARTMENT_OTHER)
Admission: EM | Admit: 2021-04-17 | Discharge: 2021-04-17 | Disposition: A | Discharge status: Home / Self Care | Payer: PRIVATE HEALTH INSURANCE | Attending: Emergency Medicine | Admitting: Emergency Medicine

## 2021-04-17 ENCOUNTER — Other Ambulatory Visit: Payer: Self-pay

## 2021-04-17 ENCOUNTER — Encounter (HOSPITAL_BASED_OUTPATIENT_CLINIC_OR_DEPARTMENT_OTHER): Payer: Self-pay

## 2021-04-17 DIAGNOSIS — Z7982 Long term (current) use of aspirin: Secondary | ICD-10-CM | POA: Diagnosis not present

## 2021-04-17 DIAGNOSIS — H109 Unspecified conjunctivitis: Secondary | ICD-10-CM | POA: Insufficient documentation

## 2021-04-17 DIAGNOSIS — H5789 Other specified disorders of eye and adnexa: Secondary | ICD-10-CM | POA: Diagnosis present

## 2021-04-17 MED ORDER — SULFACETAMIDE SODIUM 10 % OP SOLN: 1.0000 [drp] | OPHTHALMIC | 0 refills | Status: DC

## 2021-04-17 NOTE — ED Triage Notes (Signed)
Pt c/o left eye drainage, redness, light sensitivity x 2 days-denies injury-NAD-steady gait ?

## 2021-04-17 NOTE — Discharge Instructions (Addendum)
Use antibiotic eyedrops as directed. ?Follow-up with the eye doctor listed below or your eye doctor. ?Return to the ER if you start to experience worsening symptoms, swelling around your eye, trouble opening your eye, loss of your vision or fever. ?

## 2021-04-17 NOTE — ED Provider Notes (Signed)
?[facility] EMERGENCY DEPARTMENT ?Provider Note ? ? ?CSN: [id number] ?Arrival date & time: 04/17/21  1406 ? ?  ? ?History ? ?Chief Complaint  ?Patient presents with  ? Eye Drainage  ? ? ?Sarah Larson is a 56 y.o. female presenting to the ED with a chief complaint of left eye irritation, redness and drainage.  Symptoms began 2 days ago.  States that she woke up with her eyelids matted together on the left side.  She has not tried any medicine to help with her symptoms.  Reports clear drainage as well.  She wears corrective glasses but denies any contact lens use or trauma to the area.  She is scheduled to see her eye doctor next month for updated glasses prescription.  Denies any fever, loss of vision, pain with EOMs or sick contacts with similar symptoms ? ?HPI ? ?  ? ?Home Medications ?Prior to Admission medications   ?Medication Sig Start Date End Date Taking? Authorizing Provider  ?aspirin EC 81 MG EC tablet Take 1 tablet (81 mg total) by mouth daily. 07/30/18  Yes Kroeger, Ovidio Kin., PA-C  ?atorvastatin (LIPITOR) 20 MG tablet Take 1 tablet (20 mg total) by mouth daily. 07/30/18  Yes Kroeger, Dot Lanes M., PA-C  ?diltiazem (DILACOR XR) 180 MG 24 hr capsule Take 180 mg by mouth daily.   Yes [provider]  ?fluticasone (FLONASE) 50 MCG/ACT nasal spray Place 1 spray into both nostrils daily. 02/28/21  Yes Venter, Margaux, PA-C  ?isosorbide mononitrate (IMDUR) 30 MG 24 hr tablet Take 0.5 tablets (15 mg total) by mouth daily. 07/30/18  Yes Kroeger, Ovidio Kin., PA-C  ?losartan (COZAAR) 25 MG tablet Take 25 mg by mouth daily.   Yes [provider]  ?metoprolol succinate (TOPROL-XL) 25 MG 24 hr tablet Take 50 mg by mouth daily.   Yes [provider]  ?omeprazole (PRILOSEC) 20 MG capsule Take by mouth. 02/21/21  Yes [provider]  ?sulfacetamide (BLEPH-10) 10 % ophthalmic solution Place 1-2 drops into the left eye every 4 (four) hours. 04/17/21  Yes Lelan Cush, PA-C  ?naproxen  (NAPROSYN) 375 MG tablet Take 1 tablet (375 mg total) by mouth 2 (two) times daily. 08/20/19   Linwood Dibbles, MD  ?nitroGLYCERIN (NITROSTAT) 0.4 MG SL tablet Place 1 tablet (0.4 mg total) under the tongue every 5 (five) minutes as needed for chest pain. 07/30/18 07/30/19  Beatriz Stallion., PA-C  ?penicillin v potassium (VEETID) 500 MG tablet Take 1 tablet (500 mg total) by mouth 3 (three) times daily. 08/20/19   Linwood Dibbles, MD  ?potassium chloride (KLOR-CON) 10 MEQ tablet Take 1 tablet (10 mEq total) by mouth daily for 5 days. 02/28/21 03/05/21  Tanda Rockers, PA-C  ?   ? ?Allergies    ?Metronidazole   ? ?Review of Systems   ?Review of Systems  ?Constitutional:  Negative for fever.  ?HENT:  Negative for facial swelling.   ?Eyes:  Positive for discharge, redness and itching. Negative for photophobia, pain and visual disturbance.  ? ?Physical Exam ?Updated Vital Signs ?BP (!) 133/93 (BP Location: Left Arm)   Pulse 93   Temp 98.5 ?F (36.9 ?C) (Oral)   Resp 18   Ht 5\' 3"  (1.6 m)   Wt 67.6 kg   SpO2 100%   BMI 26.39 kg/m?  ?Physical Exam ?Vitals and nursing note reviewed.  ?Constitutional:   ?   General: She is not in acute distress. ?   Appearance: She is well-developed. She is not diaphoretic.  ?  HENT:  ?   Head: Normocephalic and atraumatic.  ?Eyes:  ?   General: Lids are normal. No scleral icterus. ?   Conjunctiva/sclera:  ?   Left eye: Left conjunctiva is injected.  ?   Comments: Left-sided conjunctival injection.  Pupils are equal and reactive to light bilaterally.  There is no periorbital edema.  EOMs are intact.  Visual fields are intact.  There is mild clear drainage seen on exam.  ?Pulmonary:  ?   Effort: Pulmonary effort is normal. No respiratory distress.  ?Musculoskeletal:  ?   Cervical back: Normal range of motion.  ?Skin: ?   Findings: No rash.  ?Neurological:  ?   Mental Status: She is alert.  ? ? ?ED Results / Procedures / Treatments   ?Labs ?(all labs ordered are listed, but only abnormal results are  displayed) ?Labs Reviewed - No data to display ? ?EKG ?None ? ?Radiology ?No results found. ? ?Procedures ?Procedures  ? ? ?Medications Ordered in ED ?Medications - No data to display ? ?ED Course/ Medical Decision Making/ A&P ?  ?                        ?Medical Decision Making ? ?56 year old female presenting to the ED for left eye redness, irritation and purulent drainage.  Denies injury or foreign body sensation.  On exam EOMs are intact.  Visual fields are intact.  There is no periorbital edema, proptosis or tenderness.  She denies any changes to vision.  She is hemodynamically stable.  Suspect that symptoms could be due to conjunctivitis.  Will treat for this.  I doubt symptoms are due to corneal abrasion, orbital cellulitis or other emergent cause based on physical exam findings and history.  Patient is agreeable to the plan.  Return precautions given ? ? ?Patient is hemodynamically stable, in NAD, and able to ambulate in the ED. Evaluation does not show pathology that would require ongoing emergent intervention or inpatient treatment. I explained the diagnosis to the patient. Pain has been managed and has no complaints prior to discharge. Patient is comfortable with above plan and is stable for discharge at this time. All questions were answered prior to disposition. Strict return precautions for returning to the ED were discussed. Encouraged follow up with PCP.  ? ?An After Visit Summary was printed and given to the patient. ? ? ?Portions of this note were generated with Scientist, clinical (histocompatibility and immunogenetics). Dictation errors may occur despite best attempts at proofreading. ? ? ? ? ? ? ? ? ?Final Clinical Impression(s) / ED Diagnoses ?Final diagnoses:  ?Conjunctivitis of left eye, unspecified conjunctivitis type  ? ? ?Rx / DC Orders ?ED Discharge Orders   ? ?      Ordered  ?  sulfacetamide (BLEPH-10) 10 % ophthalmic solution  Every 4 hours       ? 04/17/21 1504  ? ?  ?  ? ?  ? ? ?  Dietrich Pates, PA-C ?04/17/21  1506 ? ?  ?Vanetta Mulders, MD ?04/22/21 [phone] ? ?

## 2022-01-02 ENCOUNTER — Encounter (HOSPITAL_BASED_OUTPATIENT_CLINIC_OR_DEPARTMENT_OTHER): Payer: Self-pay

## 2022-01-02 ENCOUNTER — Emergency Department (HOSPITAL_BASED_OUTPATIENT_CLINIC_OR_DEPARTMENT_OTHER)
Admission: EM | Admit: 2022-01-02 | Discharge: 2022-01-02 | Disposition: A | Discharge status: Home / Self Care | Payer: PRIVATE HEALTH INSURANCE | Attending: Emergency Medicine | Admitting: Emergency Medicine

## 2022-01-02 ENCOUNTER — Other Ambulatory Visit: Payer: Self-pay

## 2022-01-02 ENCOUNTER — Emergency Department (HOSPITAL_BASED_OUTPATIENT_CLINIC_OR_DEPARTMENT_OTHER): Payer: PRIVATE HEALTH INSURANCE

## 2022-01-02 DIAGNOSIS — J111 Influenza due to unidentified influenza virus with other respiratory manifestations: Secondary | ICD-10-CM

## 2022-01-02 DIAGNOSIS — Z79899 Other long term (current) drug therapy: Secondary | ICD-10-CM | POA: Insufficient documentation

## 2022-01-02 DIAGNOSIS — I10 Essential (primary) hypertension: Secondary | ICD-10-CM | POA: Insufficient documentation

## 2022-01-02 DIAGNOSIS — F17200 Nicotine dependence, unspecified, uncomplicated: Secondary | ICD-10-CM | POA: Insufficient documentation

## 2022-01-02 DIAGNOSIS — Z20822 Contact with and (suspected) exposure to covid-19: Secondary | ICD-10-CM | POA: Diagnosis not present

## 2022-01-02 DIAGNOSIS — Z7982 Long term (current) use of aspirin: Secondary | ICD-10-CM | POA: Insufficient documentation

## 2022-01-02 DIAGNOSIS — R059 Cough, unspecified: Secondary | ICD-10-CM | POA: Diagnosis present

## 2022-01-02 DIAGNOSIS — J101 Influenza due to other identified influenza virus with other respiratory manifestations: Secondary | ICD-10-CM | POA: Insufficient documentation

## 2022-01-02 LAB — RESP PANEL BY RT-PCR (FLU A&B, COVID) ARPGX2
Influenza A by PCR: POSITIVE — AB
Influenza B by PCR: NEGATIVE
SARS Coronavirus 2 by RT PCR: NEGATIVE

## 2022-01-02 MED ORDER — ALBUTEROL SULFATE HFA 108 (90 BASE) MCG/ACT IN AERS
2.0000 | INHALATION_SPRAY | Freq: Once | RESPIRATORY_TRACT | Status: AC
  Administered 2022-01-02: 2 via RESPIRATORY_TRACT
  Filled 2022-01-02: qty 6.7

## 2022-01-02 MED ORDER — DEXAMETHASONE 4 MG PO TABS
8.0000 mg | ORAL_TABLET | Freq: Once | ORAL | Status: AC
  Administered 2022-01-02: 8 mg via ORAL
  Filled 2022-01-02: qty 2

## 2022-01-02 NOTE — ED Triage Notes (Signed)
Patient arrives POV c/o dry cough that started on Saturday. Pt states "she feels a rattle" in her chest. Pt states grandchildren at home are sick and are "coughing and sneezing." Pt states she has a grandson who currently has the flu. Pt denies fever.

## 2022-01-02 NOTE — ED Provider Notes (Signed)
[facility] EMERGENCY DEPARTMENT Provider Note   CSN: [id number] Arrival date & time: 01/02/22  0022     History  Chief Complaint  Patient presents with   Cough    Sarah Larson is a 56 y.o. female.  The history is provided by the patient.  Patient with history of hypertension presents with cough.  She reports over the past several days she has had increasing dry cough.  She reports shortness of breath only with cough.  No fevers.  No vomiting.  No hemoptysis reported.  She is a current smoker.  She reports sick contacts No severe chest pain    Past Medical History:  Diagnosis Date   Atrial tachycardia    GERD (gastroesophageal reflux disease)    Hypertension    Hypokalemia    SVT (supraventricular tachycardia)     Home Medications Prior to Admission medications   Medication Sig Start Date End Date Taking? Authorizing Provider  aspirin EC 81 MG EC tablet Take 1 tablet (81 mg total) by mouth daily. 07/30/18   Kroeger, Ovidio Kin., PA-C  atorvastatin (LIPITOR) 20 MG tablet Take 1 tablet (20 mg total) by mouth daily. 07/30/18   Kroeger, Ovidio Kin., PA-C  diltiazem (DILACOR XR) 180 MG 24 hr capsule Take 180 mg by mouth daily.    [provider]  fluticasone (FLONASE) 50 MCG/ACT nasal spray Place 1 spray into both nostrils daily. 02/28/21   Tanda Rockers, PA-C  isosorbide mononitrate (IMDUR) 30 MG 24 hr tablet Take 0.5 tablets (15 mg total) by mouth daily. 07/30/18   Kroeger, Ovidio Kin., PA-C  losartan (COZAAR) 25 MG tablet Take 25 mg by mouth daily.    [provider]  metoprolol succinate (TOPROL-XL) 25 MG 24 hr tablet Take 50 mg by mouth daily.    [provider]  naproxen (NAPROSYN) 375 MG tablet Take 1 tablet (375 mg total) by mouth 2 (two) times daily. 08/20/19   Linwood Dibbles, MD  nitroGLYCERIN (NITROSTAT) 0.4 MG SL tablet Place 1 tablet (0.4 mg total) under the tongue every 5 (five) minutes as needed for chest pain. 07/30/18 07/30/19  Kroeger, Ovidio Kin., PA-C  omeprazole (PRILOSEC) 20 MG capsule Take by mouth. 02/21/21   [provider]  penicillin v potassium (VEETID) 500 MG tablet Take 1 tablet (500 mg total) by mouth 3 (three) times daily. 08/20/19   Linwood Dibbles, MD  potassium chloride (KLOR-CON) 10 MEQ tablet Take 1 tablet (10 mEq total) by mouth daily for 5 days. 02/28/21 03/05/21  Tanda Rockers, PA-C  sulfacetamide (BLEPH-10) 10 % ophthalmic solution Place 1-2 drops into the left eye every 4 (four) hours. 04/17/21   Khatri, Hina, PA-C      Allergies    Metronidazole    Review of Systems   Review of Systems  Constitutional:  Positive for fatigue.  Respiratory:  Positive for cough.     Physical Exam Updated Vital Signs BP 122/85 (BP Location: Left Arm)   Pulse 92   Temp 99.7 F (37.6 C) (Oral)   Resp 20   Ht 1.6 m (5\' 3" )   Wt 74.3 kg   SpO2 94%   BMI 29.02 kg/m  Physical Exam CONSTITUTIONAL: Well developed/well nourished HEAD: Normocephalic/atraumatic EYES: EOMI ENMT: Mucous membranes moist NECK: supple no meningeal signs SPINE/BACK:entire spine nontender CV: S1/S2 noted, no murmurs/rubs/gallops noted LUNGS: Scattered wheezing bilaterally, no acute distress ABDOMEN: soft, nontender, no rebound or guarding, bowel sounds noted throughout abdomen GU:no cva tenderness NEURO: Pt is awake/alert/appropriate,  moves all extremitiesx4.  No facial droop.   EXTREMITIES:  full ROM SKIN: warm, color normal PSYCH: no abnormalities of mood noted, alert and oriented to situation  ED Results / Procedures / Treatments   Labs (all labs ordered are listed, but only abnormal results are displayed) Labs Reviewed  RESP PANEL BY RT-PCR (FLU A&B, COVID) ARPGX2 - Abnormal; Notable for the following components:      Result Value   Influenza A by PCR POSITIVE (*)    All other components within normal limits    EKG None  Radiology DG Chest Portable 1 View  Result Date: 01/02/2022 CLINICAL DATA:  56 year old female with  cough. EXAM: PORTABLE CHEST 1 VIEW COMPARISON:  Portable chest 07/13/2020 and earlier. FINDINGS: Portable AP semi upright view at 0400 hours. Lung volumes and mediastinal contours remain normal. Lung markings appear stable and within normal limits. Allowing for portable technique the lungs are clear. Asymmetric 1st rib costochondral calcification. No pneumothorax or pleural effusion. No acute osseous abnormality identified. Negative visible bowel gas. IMPRESSION: Negative portable chest. Electronically Signed   By: Odessa Fleming M.D.   On: 01/02/2022 04:25    Procedures Procedures    Medications Ordered in ED Medications  albuterol (VENTOLIN HFA) 108 (90 Base) MCG/ACT inhaler 2 puff (2 puffs Inhalation Given 01/02/22 0412)  dexamethasone (DECADRON) tablet 8 mg (8 mg Oral Given 01/02/22 0406)    ED Course/ Medical Decision Making/ A&P Clinical Course as of 01/02/22 0535  Thu Jan 02, 2022  0534 Patient reports she has had a cough for about 5 days.  She had sick contacts at home, and was found to be positive for influenza.  She is out of the window for Tamiflu at this time. [DW]  5784 She feels overall improved.  No hypoxia, no distress.  I personally visualized the x-ray and is negative. [DW]  [phone] Patient safe for discharge home.  I discussed strict return precautions with patient.  She was counseled on need to cut back on smoking [DW]    Clinical Course User Index [DW] Zadie Rhine, MD                           Medical Decision Making Amount and/or Complexity of Data Reviewed Radiology: ordered.  Risk Prescription drug management.   This patient presents to the ED for concern of cough, this involves an extensive number of treatment options, and is a complaint that carries with it a high risk of complications and morbidity.  The differential diagnosis includes but is not limited to pneumonia, influenza, COVID-19, viral URI  Comorbidities that complicate the patient evaluation: Patient's  presentation is complicated by their history of hypertension  Social Determinants of Health: Patient's  ongoing tobacco use   increases the complexity of managing their presentation  Lab Tests: I Ordered, and personally interpreted labs.  The pertinent results include: Positive for influenza  Imaging Studies ordered: I ordered imaging studies including X-ray chest   I independently visualized and interpreted imaging which showed no acute findings I agree with the radiologist interpretation   Medicines ordered and prescription drug management: I ordered medication including albuterol and Decadron for cough Reevaluation of the patient after these medicines showed that the patient    improved  Reevaluation: After the interventions noted above, I reevaluated the patient and found that they have :improved  Complexity of problems addressed: Patient's presentation is most consistent with  acute presentation with potential threat to  life or bodily function  Disposition: After consideration of the diagnostic results and the patient's response to treatment,  I feel that the patent would benefit from discharge   .           Final Clinical Impression(s) / ED Diagnoses Final diagnoses:  Influenza    Rx / DC Orders ED Discharge Orders     None         Zadie Rhine, MD 01/02/22 [phone]

## 2022-02-11 ENCOUNTER — Emergency Department (HOSPITAL_BASED_OUTPATIENT_CLINIC_OR_DEPARTMENT_OTHER)
Admission: EM | Admit: 2022-02-11 | Discharge: 2022-02-11 | Disposition: A | Discharge status: Home / Self Care | Payer: PRIVATE HEALTH INSURANCE | Attending: Emergency Medicine | Admitting: Emergency Medicine

## 2022-02-11 ENCOUNTER — Emergency Department (HOSPITAL_BASED_OUTPATIENT_CLINIC_OR_DEPARTMENT_OTHER): Payer: PRIVATE HEALTH INSURANCE

## 2022-02-11 ENCOUNTER — Other Ambulatory Visit: Payer: Self-pay

## 2022-02-11 DIAGNOSIS — Z7982 Long term (current) use of aspirin: Secondary | ICD-10-CM | POA: Insufficient documentation

## 2022-02-11 DIAGNOSIS — M5412 Radiculopathy, cervical region: Secondary | ICD-10-CM | POA: Diagnosis not present

## 2022-02-11 DIAGNOSIS — T783XXA Angioneurotic edema, initial encounter: Secondary | ICD-10-CM | POA: Diagnosis not present

## 2022-02-11 DIAGNOSIS — I10 Essential (primary) hypertension: Secondary | ICD-10-CM | POA: Insufficient documentation

## 2022-02-11 DIAGNOSIS — F17210 Nicotine dependence, cigarettes, uncomplicated: Secondary | ICD-10-CM | POA: Insufficient documentation

## 2022-02-11 DIAGNOSIS — R600 Localized edema: Secondary | ICD-10-CM | POA: Diagnosis present

## 2022-02-11 DIAGNOSIS — M25512 Pain in left shoulder: Secondary | ICD-10-CM

## 2022-02-11 DIAGNOSIS — Z79899 Other long term (current) drug therapy: Secondary | ICD-10-CM | POA: Diagnosis not present

## 2022-02-11 MED ORDER — HYDROCODONE-ACETAMINOPHEN 5-325 MG PO TABS: 1.0000 | ORAL_TABLET | Freq: Four times a day (QID) | ORAL | 0 refills | Status: AC | PRN

## 2022-02-11 NOTE — ED Triage Notes (Addendum)
Pt here L shoulder pain that radiates up into neck x1 week, states she has been taking ibuprofen, using heat and ice without any relief. Pt reports she does lift heavy things at work, has limited ROM, sensation intact, radial pulse strong. Pt also reports her upper lip is swollen, this started today.

## 2022-02-11 NOTE — ED Notes (Signed)
Pt reports that last Tuesday her Left shoulder began to hurt.  She states she knows of no direct injury however she does have a physical job.  She describes the 8/10 pain as "tenchen" and radiates to her neck and down her arm.  She describes having a "poor range of motion" and it hurts more when it is moved.  Pt reports taking many   She also reports some swelling of the lips that started today at 6pm.  Pt is able to talk in complete sentences, control her salvia and there is no evidence of respiratory distress.

## 2022-02-11 NOTE — ED Provider Notes (Signed)
[facility] Provider Note: Georgena Spurling, MD, FACEP  CSN: [id number] MRN: [id number] ARRIVAL: 02/11/22 at [place]: [place]  Shoulder Pain   HISTORY OF PRESENT ILLNESS  02/11/22 5:57 AM Sarah Larson is a 57 y.o. female with 1 week of left-sided neck pain that radiates to her left shoulder and sometimes to her scapula, her left upper chest and down her left arm.  She is also having pain in her shoulder itself and has limited ability to abduct or internally rotate her left shoulder.  She has been using ibuprofen, heat and ice without adequate relief.  She rates the pain as an 8 out of 10, worse with movement of her shoulder and less so with movement of her neck, particularly leaning her neck to the right.    She has also noted mild edema of her upper lip since yesterday evening about 6 PM.  Her medication list indicates she was previously on lisinopril and Hyzaar but she denies taking either of those drugs currently.  Past Medical History:  Diagnosis Date   Atrial tachycardia    GERD (gastroesophageal reflux disease)    Hypertension    Hypokalemia    SVT (supraventricular tachycardia)     Past Surgical History:  Procedure Laterality Date   ABDOMINAL HYSTERECTOMY     APPENDECTOMY     EYE SURGERY     LEFT HEART CATH AND CORONARY ANGIOGRAPHY N/A 07/29/2018   Procedure: LEFT HEART CATH AND CORONARY ANGIOGRAPHY;  Surgeon: Jettie Booze, MD;  Location: [facility];  Service: Cardiovascular;  Laterality: N/A;    Family History  Problem Relation Age of Onset   Hypertension Father     Social History   Tobacco Use   Smoking status: Every Day    Packs/day: 0.50    Years: 30.00    Total pack years: 15.00    Types: Cigarettes   Smokeless tobacco: Never  Vaping Use   Vaping Use: Never used  Substance Use Topics   Alcohol use: Yes    Comment: occasionally   Drug use: No    Prior to Admission medications   Medication Sig  Start Date End Date Taking? Authorizing Provider  HYDROcodone-acetaminophen (NORCO) 5-325 MG tablet Take 1 tablet by mouth every 6 (six) hours as needed for severe pain. 02/11/22  Yes Jyden Kromer, MD  aspirin EC 81 MG EC tablet Take 1 tablet (81 mg total) by mouth daily. 07/30/18   Kroeger, Lorelee Cover., PA-C  atorvastatin (LIPITOR) 20 MG tablet Take 1 tablet (20 mg total) by mouth daily. 07/30/18   Kroeger, Lorelee Cover., PA-C  diltiazem (DILACOR XR) 180 MG 24 hr capsule Take 180 mg by mouth daily.    [provider]  fluticasone (FLONASE) 50 MCG/ACT nasal spray Place 1 spray into both nostrils daily. 02/28/21   Eustaquio Maize, PA-C  isosorbide mononitrate (IMDUR) 30 MG 24 hr tablet Take 0.5 tablets (15 mg total) by mouth daily. 07/30/18   Kroeger, Lorelee Cover., PA-C  losartan (COZAAR) 25 MG tablet Take 25 mg by mouth daily.    [provider]  metoprolol succinate (TOPROL-XL) 25 MG 24 hr tablet Take 50 mg by mouth daily.    [provider]  nitroGLYCERIN (NITROSTAT) 0.4 MG SL tablet Place 1 tablet (0.4 mg total) under the tongue every 5 (five) minutes as needed for chest pain. 07/30/18 07/30/19  Kroeger, Lorelee Cover., PA-C  omeprazole (PRILOSEC) 20 MG capsule Take by mouth. 02/21/21  [provider]  potassium chloride (KLOR-CON) 10 MEQ tablet Take 1 tablet (10 mEq total) by mouth daily for 5 days. 02/28/21 03/05/21  Eustaquio Maize, PA-C    Allergies Metronidazole   REVIEW OF SYSTEMS  Negative except as noted here or in the History of Present Illness.   PHYSICAL EXAMINATION  Initial Vital Signs Blood pressure (!) 151/92, pulse 75, temperature 97.8 F (36.6 C), resp. rate 18, SpO2 96 %.  Examination General: Well-developed, well-nourished female in no acute distress; appearance consistent with age of record HENT: normocephalic; atraumatic Eyes: Normal appearance Neck: supple; leaning her head to the right exacerbates pain in the neck and shoulder Heart: regular rate and  rhythm Lungs: clear to auscultation bilaterally Abdomen: soft; nondistended; nontender; bowel sounds present Extremities: No deformity; tenderness to palpation of and on attempted movement of left shoulder, limited ability to abduct or internally rotate left shoulder Neurologic: Awake, alert and oriented; motor function intact in all extremities and symmetric although examination limited due to pain in left shoulder; no facial droop Skin: Warm and dry Psychiatric: Normal mood and affect   RESULTS  Summary of this visit's results, reviewed and interpreted by myself:   EKG Interpretation  Date/Time:    Ventricular Rate:    PR Interval:    QRS Duration:   QT Interval:    QTC Calculation:   R Axis:     Text Interpretation:         Laboratory Studies: No results found for this or any previous visit (from the past 24 hour(s)). Imaging Studies: DG Shoulder Left  Result Date: 02/11/2022 CLINICAL DATA:  Left shoulder pain and limited range of motion. EXAM: LEFT SHOULDER - 2+ VIEW COMPARISON:  None Available. FINDINGS: There is no evidence of fracture or dislocation. Mild degenerative changes are present at the acromioclavicular joint. Soft tissues are unremarkable. IMPRESSION: No acute fracture or dislocation. Electronically Signed   By: Brett Fairy M.D.   On: 02/11/2022 01:01    ED COURSE and MDM  Nursing notes, initial and subsequent vitals signs, including pulse oximetry, reviewed and interpreted by myself.  Vitals:   02/11/22 0044  BP: (!) 151/92  Pulse: 75  Resp: 18  Temp: 97.8 F (36.6 C)  SpO2: 96%   Medications - No data to display  I suspect the patient is having a combination of cervical radiculopathy and left shoulder pain likely due to rotator cuff problems.  The angioedema, if she truly is not taking lisinopril or Hyzaar, could be a reaction to the ibuprofen and she was advised to discontinue ibuprofen and take Benadryl for the swelling.  The swelling is mild and  there is no intraoral or airway involvement.  We will treat her pain acutely and refer to sports medicine for further evaluation and treatment.  PROCEDURES  Procedures   ED DIAGNOSES     ICD-10-CM   1. Acute pain of left shoulder  M25.512     2. Cervical radiculopathy  M54.12     3. Angioedema of lips, initial encounter  T78.Mechele Dawley, MD 02/11/22 [phone]

## 2022-02-12 ENCOUNTER — Ambulatory Visit: Payer: PRIVATE HEALTH INSURANCE | Admitting: Family Medicine

## 2022-02-19 ENCOUNTER — Ambulatory Visit: Payer: PRIVATE HEALTH INSURANCE | Admitting: Family Medicine

## 2022-02-24 ENCOUNTER — Ambulatory Visit: Payer: PRIVATE HEALTH INSURANCE | Admitting: Family Medicine

## 2022-05-12 ENCOUNTER — Encounter: Payer: Self-pay | Admitting: *Deleted

## 2022-05-18 IMAGING — CT CT HEAD W/O CM
3 series · 14 of 47 positions shown, 16 images · non-contrast
Comparison: Prior head CT 10/11/2016.

CLINICAL DATA: Dizziness. Additional history provided:
Lightheadedness, nausea.



[Series 2: head wo · axial · 0.45mm/px · z∈[+1212,+1352]mm · 8 of 34 slices shown, 10 images]
[im 3/34  brain]
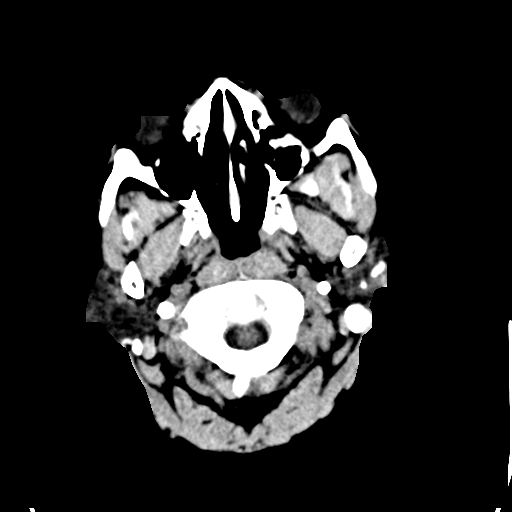
[im 3/34  bone]
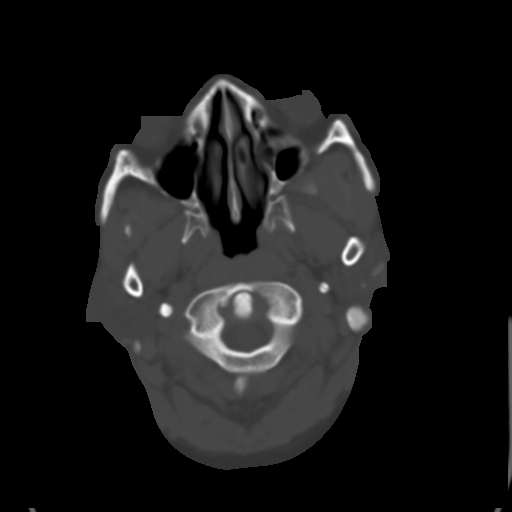
[im 7/34  brain]
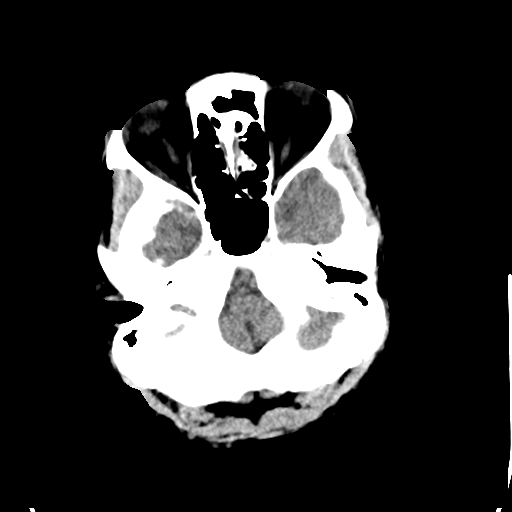
[im 11/34  brain]
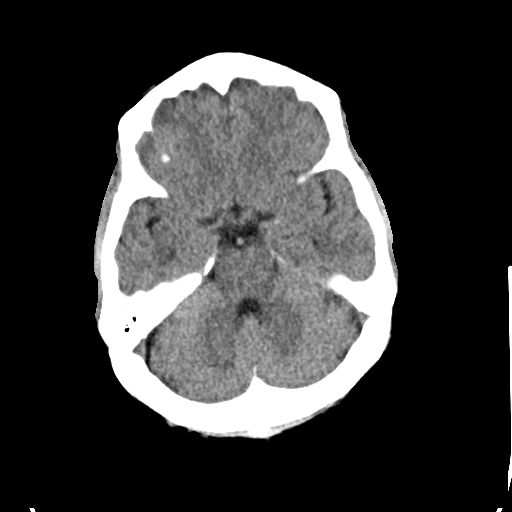
[im 15/34  brain]
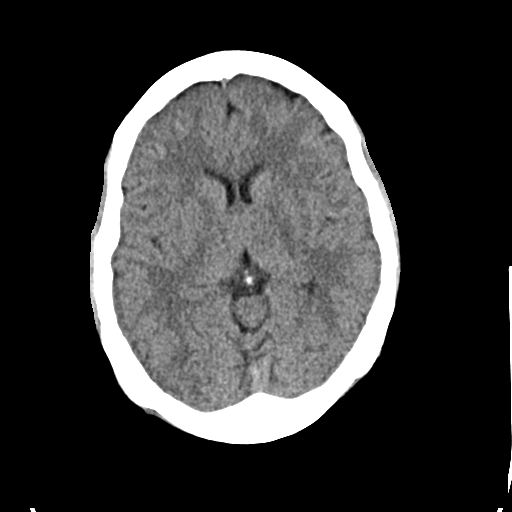
[im 19/34  brain]
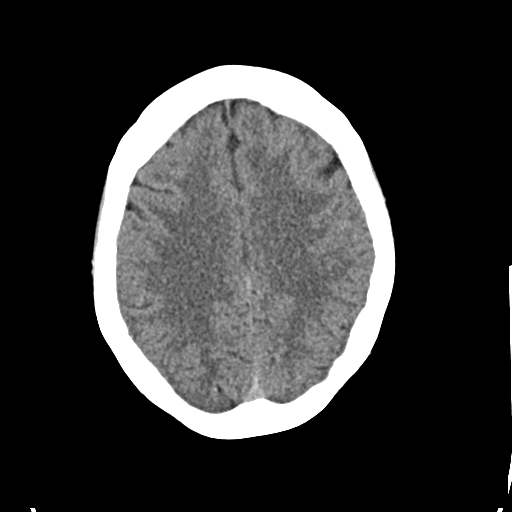
[im 19/34  bone]
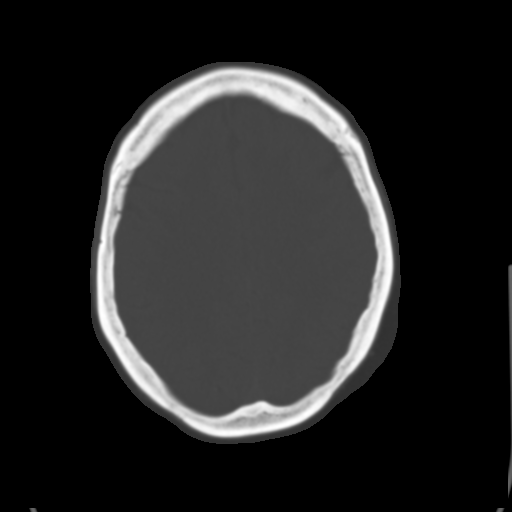
[im 23/34  brain]
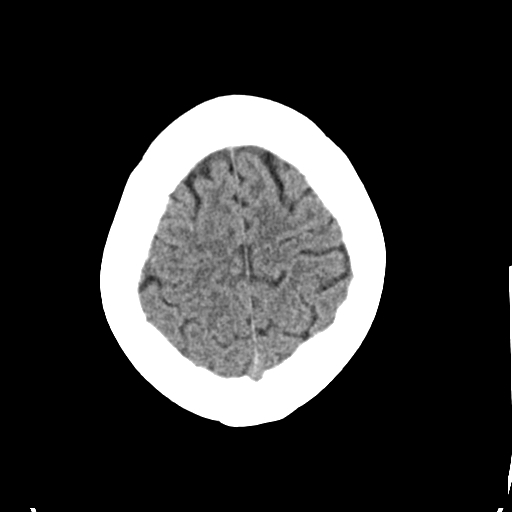
[im 27/34  brain]
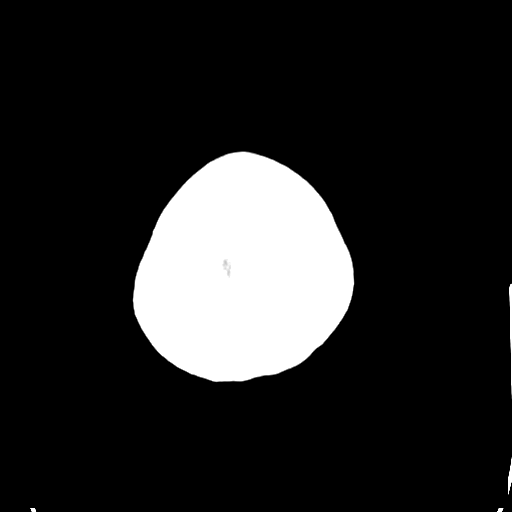
[im 31/34  brain]
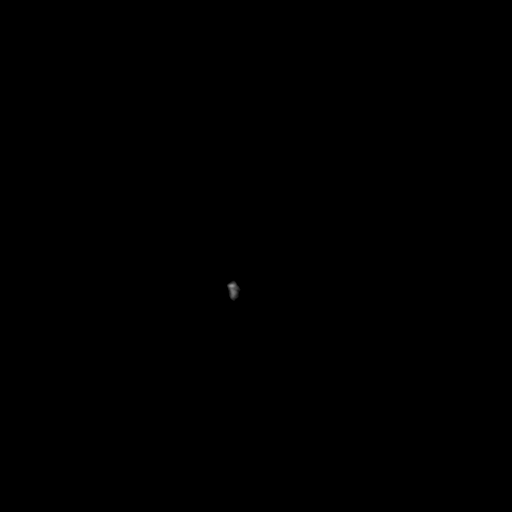

[Series 4: coronal soft · coronal · 0.33mm/px · 3 of 67 slices shown]
[im 23/67  brain]
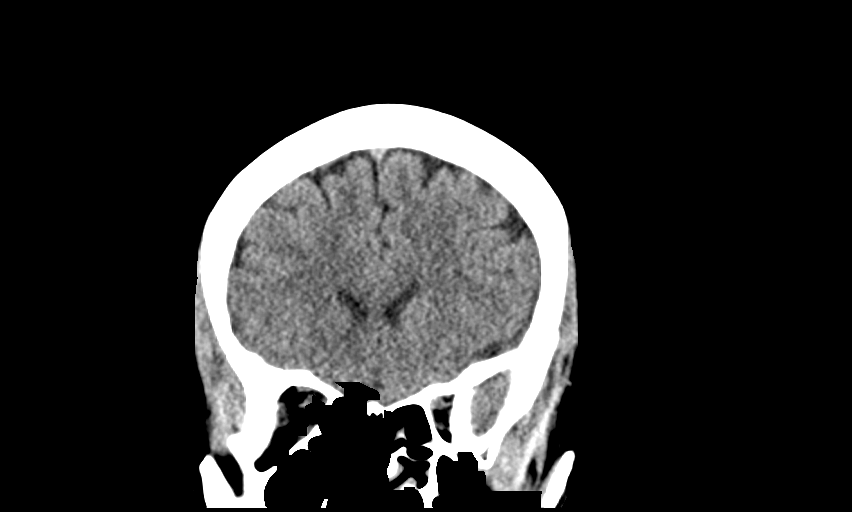
[im 30/67  brain]
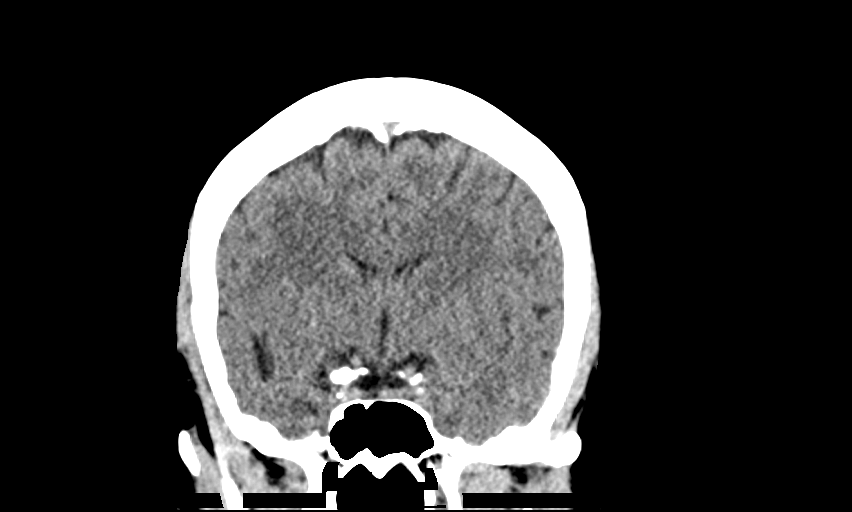
[im 37/67  brain]
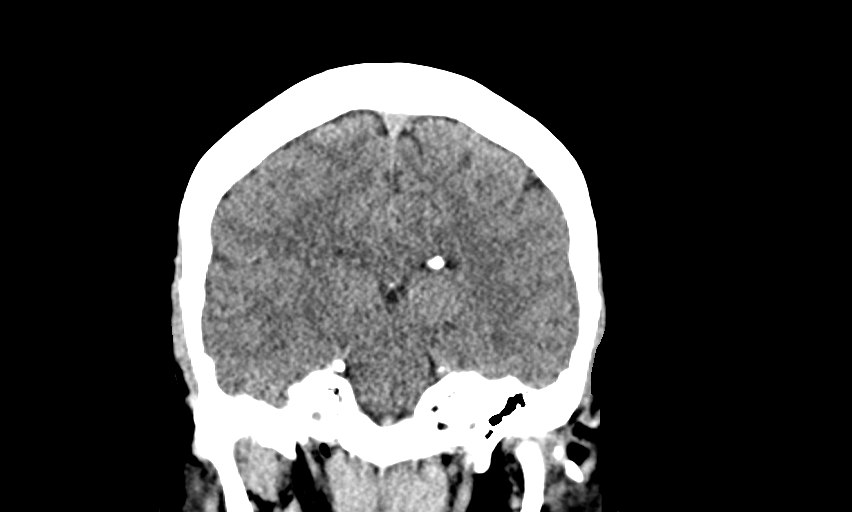

[Series 5: sag soft · sagittal · 0.33mm/px · 3 of 67 slices shown]
[im 23/67  brain]
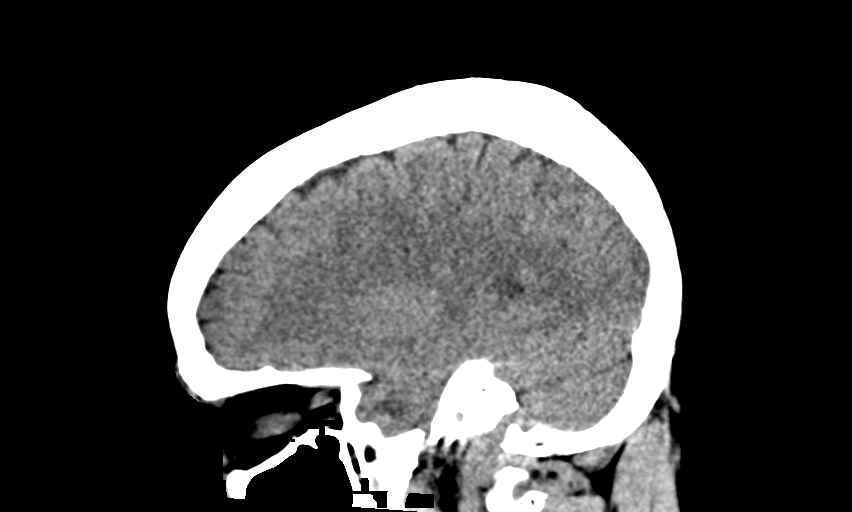
[im 34/67  brain]
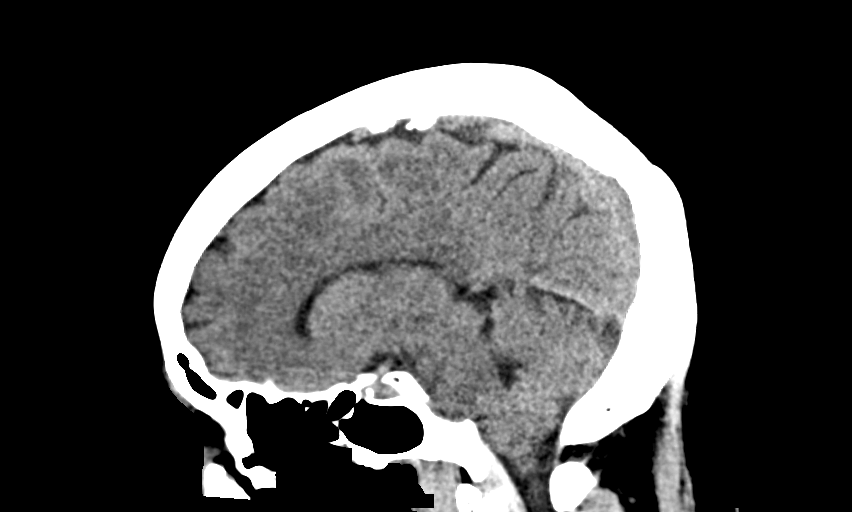
[im 45/67  brain]
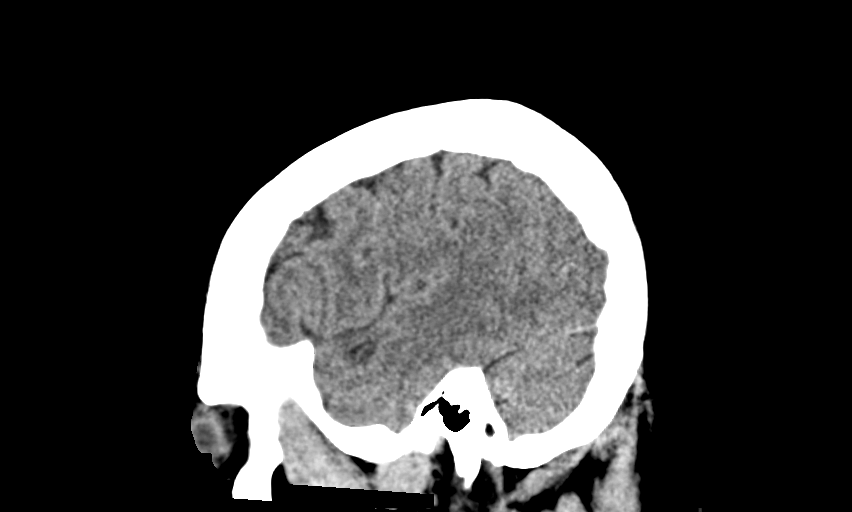

[14 of 47 positions shown; findings below may reference images not displayed]

FINDINGS: Brain:

Cerebral volume is normal.

There is no acute intracranial hemorrhage.

No demarcated cortical infarct.

No extra-axial fluid collection.

No evidence of an intracranial mass.

No midline shift.

Vascular: No hyperdense vessel. Atherosclerotic calcifications.

Skull: Normal. Negative for fracture or focal lesion.

Sinuses/Orbits: Visualized orbits show no acute finding. Mild
mucosal thickening within the left frontal and bilateral ethmoid
sinuses. Mild mucosal thickening within the bilateral maxillary
sinuses at the imaged levels.
IMPRESSION: No evidence of acute intracranial abnormality.

Mild mucosal thickening within the paranasal sinuses at the imaged
levels.

## 2022-10-20 ENCOUNTER — Emergency Department (HOSPITAL_BASED_OUTPATIENT_CLINIC_OR_DEPARTMENT_OTHER): Payer: PRIVATE HEALTH INSURANCE

## 2022-10-20 ENCOUNTER — Encounter (HOSPITAL_BASED_OUTPATIENT_CLINIC_OR_DEPARTMENT_OTHER): Payer: Self-pay

## 2022-10-20 ENCOUNTER — Emergency Department (HOSPITAL_BASED_OUTPATIENT_CLINIC_OR_DEPARTMENT_OTHER)
Admission: EM | Admit: 2022-10-20 | Discharge: 2022-10-21 | Disposition: A | Discharge status: Home / Self Care | Payer: 59 | Attending: Emergency Medicine | Admitting: Emergency Medicine

## 2022-10-20 ENCOUNTER — Other Ambulatory Visit: Payer: Self-pay

## 2022-10-20 DIAGNOSIS — R0602 Shortness of breath: Secondary | ICD-10-CM | POA: Insufficient documentation

## 2022-10-20 DIAGNOSIS — Z7982 Long term (current) use of aspirin: Secondary | ICD-10-CM | POA: Diagnosis not present

## 2022-10-20 DIAGNOSIS — R0789 Other chest pain: Secondary | ICD-10-CM | POA: Diagnosis not present

## 2022-10-20 DIAGNOSIS — R002 Palpitations: Secondary | ICD-10-CM | POA: Insufficient documentation

## 2022-10-20 LAB — CBC
HCT: 35.6 % — ABNORMAL LOW (ref 36.0–46.0)
Hemoglobin: 12 g/dL (ref 12.0–15.0)
MCH: 30 pg (ref 26.0–34.0)
MCHC: 33.7 g/dL (ref 30.0–36.0)
MCV: 89 fL (ref 80.0–100.0)
Platelets: 272 10*3/uL (ref 150–400)
RBC: 4 MIL/uL (ref 3.87–5.11)
RDW: 13.8 % (ref 11.5–15.5)
WBC: 9.8 10*3/uL (ref 4.0–10.5)
nRBC: 0 % (ref 0.0–0.2)

## 2022-10-20 NOTE — ED Triage Notes (Signed)
Patient arrives with shortness of breath that started at 1700. Patient reports her blood pressure is high and has been taking her medication regularly. Denies headache, blurred vision, or dizziness. States she becomes short of breath mostly with exertion which also causes her chest to have a dull ache. No blood thinners.

## 2022-10-20 NOTE — ED Provider Notes (Signed)
[facility] EMERGENCY DEPARTMENT AT [facility]  Provider Note  CSN: [id number] Arrival date & time: 10/20/22 2322  History Chief Complaint  Patient presents with   Shortness of Breath    Sarah Larson is a 57 y.o. female reports while she was at work earlier [employer] she had intermittent episodes of feeling SOB and heart racing with some chest discomfort. She reports her apple watch was showing heart rates up to 130bpm. She states she missed two doses of her medications over the weekend (including metoprolol and diltiazem) but did take them today.    Home Medications Prior to Admission medications   Medication Sig Start Date End Date Taking? Authorizing Provider  aspirin EC 81 MG EC tablet Take 1 tablet (81 mg total) by mouth daily. 07/30/18   Kroeger, Ovidio Kin., PA-C  atorvastatin (LIPITOR) 20 MG tablet Take 1 tablet (20 mg total) by mouth daily. 07/30/18   Kroeger, Ovidio Kin., PA-C  diltiazem (DILACOR XR) 180 MG 24 hr capsule Take 180 mg by mouth daily.    [provider]  fluticasone (FLONASE) 50 MCG/ACT nasal spray Place 1 spray into both nostrils daily. 02/28/21   Tanda Rockers, PA-C  HYDROcodone-acetaminophen (NORCO) 5-325 MG tablet Take 1 tablet by mouth every 6 (six) hours as needed for severe pain. 02/11/22   Molpus, John, MD  isosorbide mononitrate (IMDUR) 30 MG 24 hr tablet Take 0.5 tablets (15 mg total) by mouth daily. 07/30/18   Kroeger, Ovidio Kin., PA-C  losartan (COZAAR) 25 MG tablet Take 25 mg by mouth daily.    [provider]  metoprolol succinate (TOPROL-XL) 25 MG 24 hr tablet Take 50 mg by mouth daily.    [provider]  nitroGLYCERIN (NITROSTAT) 0.4 MG SL tablet Place 1 tablet (0.4 mg total) under the tongue every 5 (five) minutes as needed for chest pain. 07/30/18 07/30/19  Kroeger, Ovidio Kin., PA-C  omeprazole (PRILOSEC) 20 MG capsule Take by mouth. 02/21/21   [provider]  potassium chloride (KLOR-CON) 10 MEQ tablet Take 1  tablet (10 mEq total) by mouth daily for 5 days. 02/28/21 03/05/21  Tanda Rockers, PA-C     Allergies    Metronidazole   Review of Systems   Review of Systems Please see HPI for pertinent positives and negatives  Physical Exam BP 122/74   Pulse 62   Temp 97.8 F (36.6 C)   Resp 17   Ht 5\' 3"  (1.6 m)   Wt 74.8 kg   SpO2 99%   BMI 29.23 kg/m   Physical Exam Vitals and nursing note reviewed.  Constitutional:      Appearance: Normal appearance.  HENT:     Head: Normocephalic and atraumatic.     Nose: Nose normal.     Mouth/Throat:     Mouth: Mucous membranes are moist.  Eyes:     Extraocular Movements: Extraocular movements intact.     Conjunctiva/sclera: Conjunctivae normal.  Cardiovascular:     Rate and Rhythm: Normal rate and regular rhythm.  Pulmonary:     Effort: Pulmonary effort is normal.     Breath sounds: Normal breath sounds.  Abdominal:     General: Abdomen is flat.     Palpations: Abdomen is soft.     Tenderness: There is no abdominal tenderness.  Musculoskeletal:        General: No swelling. Normal range of motion.     Cervical back: Neck supple.     Right lower leg: No edema.  Left lower leg: No edema.  Skin:    General: Skin is warm and dry.  Neurological:     General: No focal deficit present.     Mental Status: She is alert.  Psychiatric:        Mood and Affect: Mood normal.     ED Results / Procedures / Treatments   EKG EKG Interpretation Date/Time:  Monday October 20 2022 23:30:06 EDT Ventricular Rate:  97 PR Interval:  163 QRS Duration:  84 QT Interval:  347 QTC Calculation: 441 R Axis:   44  Text Interpretation: Sinus rhythm Normal ECG Confirmed by Susy Frizzle [phone]) on 10/20/2022 11:39:53 PM  Procedures Procedures  Medications Ordered in the ED Medications - No data to display  Initial Impression and Plan  Patient here with reports of elevated HR at work this afternoon with some SOB and chest discomfort. She had  missed her BP meds the last two days, so this may be contributing. Currently NSR with controlled rate. Will check labs, CXR and monitor for rhythm change.   ED Course   Clinical Course as of 10/21/22 0208  Tue Oct 21, 2022  0005 CBC and BMP unremarkable.  [CS]  0018 Initial Trop is normal.  [CS]  0059 BNP is normal.  [CS]  0100 I personally viewed the images from radiology studies and agree with radiologist interpretation: CXR is clear. Will continue to monitor for delta trop.  [CS]  0207 Delta trop remains normal. Patient has been asymptomatic since arrival. No tachycardia on monitor. She was advised to continue her medications as instructed. PCP follow up, RTED for any other concerns.   [CS]    Clinical Course User Index [CS] Pollyann Savoy, MD     MDM Rules/Calculators/A&P Medical Decision Making Given presenting complaint, I considered that admission might be necessary. After review of results from ED lab and/or imaging studies, admission to the hospital is not indicated at this time.    Problems Addressed: Palpitations: acute illness or injury  Amount and/or Complexity of Data Reviewed Labs: ordered. Decision-making details documented in ED Course. Radiology: ordered and independent interpretation performed. Decision-making details documented in ED Course. ECG/medicine tests: ordered and independent interpretation performed. Decision-making details documented in ED Course.  Risk Prescription drug management. Decision regarding hospitalization.     Final Clinical Impression(s) / ED Diagnoses Final diagnoses:  Palpitations    Rx / DC Orders ED Discharge Orders     None        Pollyann Savoy, MD 10/21/22 [phone]

## 2022-10-21 LAB — BASIC METABOLIC PANEL
Anion gap: 13 (ref 5–15)
BUN: 16 mg/dL (ref 6–20)
CO2: 22 mmol/L (ref 22–32)
Calcium: 9.2 mg/dL (ref 8.9–10.3)
Chloride: 105 mmol/L (ref 98–111)
Creatinine, Ser: 0.86 mg/dL (ref 0.44–1.00)
GFR, Estimated: >60 mL/min (ref >60)
Glucose, Bld: 108 mg/dL — ABNORMAL HIGH (ref 70–99)
Potassium: 3.3 mmol/L — ABNORMAL LOW (ref 3.5–5.1)
Sodium: 140 mmol/L (ref 135–145)

## 2022-10-21 LAB — TROPONIN I (HIGH SENSITIVITY)
Troponin I (High Sensitivity): 3 ng/L (ref <18)
Troponin I (High Sensitivity): 4 ng/L (ref <18)

## 2022-10-21 LAB — BRAIN NATRIURETIC PEPTIDE: B Natriuretic Peptide: 26.7 pg/mL (ref 0.0–100.0)

## 2024-02-09 ENCOUNTER — Encounter (HOSPITAL_BASED_OUTPATIENT_CLINIC_OR_DEPARTMENT_OTHER): Payer: Self-pay

## 2024-02-09 ENCOUNTER — Other Ambulatory Visit: Payer: Self-pay

## 2024-02-09 DIAGNOSIS — K029 Dental caries, unspecified: Secondary | ICD-10-CM | POA: Insufficient documentation

## 2024-02-09 DIAGNOSIS — Z7982 Long term (current) use of aspirin: Secondary | ICD-10-CM | POA: Insufficient documentation

## 2024-02-09 NOTE — ED Triage Notes (Signed)
 Pt reports dental pain for a while. She went to the dentist in December for a consultation. She was supposed to go to the dentist for xrays on Jan 5 but due to financial reasons she was unable to make it.   Pain to two front teeth at bottom left.

## 2024-02-10 ENCOUNTER — Emergency Department (HOSPITAL_BASED_OUTPATIENT_CLINIC_OR_DEPARTMENT_OTHER)
Admission: EM | Admit: 2024-02-10 | Discharge: 2024-02-10 | Disposition: A | Payer: Self-pay | Attending: Emergency Medicine | Admitting: Emergency Medicine

## 2024-02-10 ENCOUNTER — Other Ambulatory Visit: Payer: Self-pay

## 2024-02-10 ENCOUNTER — Encounter (HOSPITAL_BASED_OUTPATIENT_CLINIC_OR_DEPARTMENT_OTHER): Payer: Self-pay

## 2024-02-10 ENCOUNTER — Emergency Department (HOSPITAL_BASED_OUTPATIENT_CLINIC_OR_DEPARTMENT_OTHER)
Admission: EM | Admit: 2024-02-10 | Discharge: 2024-02-10 | Disposition: A | Discharge status: Home / Self Care | Payer: Self-pay | Attending: Emergency Medicine | Admitting: Emergency Medicine

## 2024-02-10 DIAGNOSIS — K0889 Other specified disorders of teeth and supporting structures: Secondary | ICD-10-CM

## 2024-02-10 DIAGNOSIS — T7840XA Allergy, unspecified, initial encounter: Secondary | ICD-10-CM | POA: Insufficient documentation

## 2024-02-10 DIAGNOSIS — Z7982 Long term (current) use of aspirin: Secondary | ICD-10-CM | POA: Insufficient documentation

## 2024-02-10 MED ORDER — IBUPROFEN 600 MG PO TABS: 600.0000 mg | ORAL_TABLET | Freq: Four times a day (QID) | ORAL | 0 refills | Status: DC | PRN

## 2024-02-10 MED ORDER — IBUPROFEN 600 MG PO TABS: 600.0000 mg | ORAL_TABLET | Freq: Four times a day (QID) | ORAL | 0 refills | Status: AC | PRN

## 2024-02-10 MED ORDER — DIPHENHYDRAMINE HCL 25 MG PO CAPS
25.0000 mg | ORAL_CAPSULE | Freq: Once | ORAL | Status: AC
  Administered 2024-02-10: 25 mg via ORAL
  Filled 2024-02-10: qty 1

## 2024-02-10 MED ORDER — AMOXICILLIN-POT CLAVULANATE 875-125 MG PO TABS: 1.0000 | ORAL_TABLET | Freq: Two times a day (BID) | ORAL | 0 refills | Status: DC

## 2024-02-10 MED ORDER — PREDNISONE 50 MG PO TABS
60.0000 mg | ORAL_TABLET | Freq: Once | ORAL | Status: AC
  Administered 2024-02-10: 60 mg via ORAL
  Filled 2024-02-10: qty 1

## 2024-02-10 MED ORDER — AMOXICILLIN-POT CLAVULANATE 875-125 MG PO TABS
1.0000 | ORAL_TABLET | Freq: Once | ORAL | Status: AC
  Administered 2024-02-10: 1 via ORAL
  Filled 2024-02-10: qty 1

## 2024-02-10 MED ORDER — IBUPROFEN 400 MG PO TABS
600.0000 mg | ORAL_TABLET | Freq: Once | ORAL | Status: AC
  Administered 2024-02-10: 600 mg via ORAL
  Filled 2024-02-10: qty 1

## 2024-02-10 MED ORDER — CLINDAMYCIN HCL 150 MG PO CAPS: 300.0000 mg | ORAL_CAPSULE | Freq: Four times a day (QID) | ORAL | 0 refills | Status: AC

## 2024-02-10 NOTE — ED Provider Notes (Signed)
 "  [place] [facility]  Provider Note  CSN: [id number] Arrival date & time: 02/09/24 2311  History Chief Complaint  Patient presents with   Dental Pain    Sarah Larson is a 59 y.o. female with history of poor dentition only has 7 remaining lower teeth. She has had toothache for the last few days and reports feeling hot earlier tonight. She has not had fever. She reports she went to dentist in December but was unable to afford follow up. She is anticipating getting paid in about 2 weeks and is going to seek extraction of her remaining teeth so she can get a denture.    Home Medications Prior to Admission medications  Medication Sig Start Date End Date Taking? Authorizing Provider  amoxicillin -clavulanate (AUGMENTIN ) 875-125 MG tablet Take 1 tablet by mouth every 12 (twelve) hours. 02/10/24  Yes Roselyn Carlin NOVAK, MD  ibuprofen  (ADVIL ) 600 MG tablet Take 1 tablet (600 mg total) by mouth every 6 (six) hours as needed. 02/10/24  Yes Roselyn Carlin NOVAK, MD  aspirin  EC 81 MG EC tablet Take 1 tablet (81 mg total) by mouth daily. 07/30/18   Kroeger, Bruno HERO., PA-C  atorvastatin  (LIPITOR) 20 MG tablet Take 1 tablet (20 mg total) by mouth daily. 07/30/18   Kroeger, Krista M., PA-C  diltiazem  (DILACOR XR ) 180 MG 24 hr capsule Take 180 mg by mouth daily.    [provider]  fluticasone  (FLONASE ) 50 MCG/ACT nasal spray Place 1 spray into both nostrils daily. 02/28/21   Venter, Margaux, PA-C  HYDROcodone -acetaminophen  (NORCO) 5-325 MG tablet Take 1 tablet by mouth every 6 (six) hours as needed for severe pain. 02/11/22   Molpus, John, MD  isosorbide  mononitrate (IMDUR ) 30 MG 24 hr tablet Take 0.5 tablets (15 mg total) by mouth daily. 07/30/18   Kroeger, Krista M., PA-C  losartan  (COZAAR ) 25 MG tablet Take 25 mg by mouth daily.    [provider]  metoprolol  succinate (TOPROL -XL) 25 MG 24 hr tablet Take 50 mg by mouth daily.    [provider]   nitroGLYCERIN  (NITROSTAT ) 0.4 MG SL tablet Place 1 tablet (0.4 mg total) under the tongue every 5 (five) minutes as needed for chest pain. 07/30/18 07/30/19  Kroeger, Bruno HERO., PA-C  omeprazole (PRILOSEC) 20 MG capsule Take by mouth. 02/21/21   [provider]  potassium chloride  (KLOR-CON ) 10 MEQ tablet Take 1 tablet (10 mEq total) by mouth daily for 5 days. 02/28/21 03/05/21  Shepard Clinch, PA-C     Allergies    Metronidazole    Review of Systems   Review of Systems Please see HPI for pertinent positives and negatives  Physical Exam BP (!) 145/97   Pulse 71   Temp 98.1 F (36.7 C)   Resp 16   Ht 5' 4 (1.626 m)   Wt 68 kg   SpO2 98%   BMI 25.75 kg/m   Physical Exam Vitals and nursing note reviewed.  HENT:     Head: Normocephalic.     Nose: Nose normal.     Mouth/Throat:     Comments: Poor dentition, edentulous on uppers. There are two remaining L lower teeth with caries. No signs of deep tissue infection Eyes:     Extraocular Movements: Extraocular movements intact.  Pulmonary:     Effort: Pulmonary effort is normal.  Musculoskeletal:        General: Normal range of motion.     Cervical back: Neck supple.  Skin:  Findings: No rash (on exposed skin).  Neurological:     Mental Status: She is alert and oriented to person, place, and time.  Psychiatric:        Mood and Affect: Mood normal.     ED Results / Procedures / Treatments   EKG None  Procedures Procedures  Medications Ordered in the ED Medications  amoxicillin -clavulanate (AUGMENTIN ) 875-125 MG per tablet 1 tablet (has no administration in time range)  ibuprofen  (ADVIL ) tablet 600 mg (has no administration in time range)    Initial Impression and Plan  Patient here with uncomplicated toothache, plan Rx for Abx, NSAIDs and dental follow up when possible. RTED for any other concerns.   ED Course       MDM Rules/Calculators/A&P Medical Decision Making Problems Addressed: Toothache:  acute illness or injury  Risk Prescription drug management.     Final Clinical Impression(s) / ED Diagnoses Final diagnoses:  Toothache    Rx / DC Orders ED Discharge Orders          Ordered    amoxicillin -clavulanate (AUGMENTIN ) 875-125 MG tablet  Every 12 hours        02/10/24 0052    ibuprofen  (ADVIL ) 600 MG tablet  Every 6 hours PRN        02/10/24 0052             Roselyn Carlin NOVAK, MD 02/10/24 [phone]  "

## 2024-02-10 NOTE — ED Provider Notes (Signed)
 "  [place] [facility]  Provider Note  CSN: [id number] Arrival date & time: 02/10/24 0321  History Chief Complaint  Patient presents with   Allergic Reaction    Sarah Larson is a 59 y.o. female seen a few hours ago for toothache was given a dose of Augmentin  and reports an hour or so later she began to have lip swelling. No tongue swelling, itchy rash or difficulty breathing.    Home Medications Prior to Admission medications  Medication Sig Start Date End Date Taking? Authorizing Provider  clindamycin  (CLEOCIN ) 150 MG capsule Take 2 capsules (300 mg total) by mouth 4 (four) times daily. 02/10/24  Yes Roselyn Carlin NOVAK, MD  aspirin  EC 81 MG EC tablet Take 1 tablet (81 mg total) by mouth daily. 07/30/18   Kroeger, Bruno HERO., PA-C  atorvastatin  (LIPITOR) 20 MG tablet Take 1 tablet (20 mg total) by mouth daily. 07/30/18   Kroeger, Krista M., PA-C  diltiazem  (DILACOR XR ) 180 MG 24 hr capsule Take 180 mg by mouth daily.    [provider]  fluticasone  (FLONASE ) 50 MCG/ACT nasal spray Place 1 spray into both nostrils daily. 02/28/21   Venter, Margaux, PA-C  HYDROcodone -acetaminophen  (NORCO) 5-325 MG tablet Take 1 tablet by mouth every 6 (six) hours as needed for severe pain. 02/11/22   Molpus, John, MD  ibuprofen  (ADVIL ) 600 MG tablet Take 1 tablet (600 mg total) by mouth every 6 (six) hours as needed. 02/10/24   Roselyn Carlin NOVAK, MD  isosorbide  mononitrate (IMDUR ) 30 MG 24 hr tablet Take 0.5 tablets (15 mg total) by mouth daily. 07/30/18   Kroeger, Krista M., PA-C  losartan  (COZAAR ) 25 MG tablet Take 25 mg by mouth daily.    [provider]  metoprolol  succinate (TOPROL -XL) 25 MG 24 hr tablet Take 50 mg by mouth daily.    [provider]  nitroGLYCERIN  (NITROSTAT ) 0.4 MG SL tablet Place 1 tablet (0.4 mg total) under the tongue every 5 (five) minutes as needed for chest pain. 07/30/18 07/30/19  Kroeger, Bruno HERO., PA-C  omeprazole  (PRILOSEC) 20 MG capsule Take by mouth. 02/21/21   [provider]  potassium chloride  (KLOR-CON ) 10 MEQ tablet Take 1 tablet (10 mEq total) by mouth daily for 5 days. 02/28/21 03/05/21  Shepard Clinch, PA-C     Allergies    Amoxicillin  and Metronidazole    Review of Systems   Review of Systems Please see HPI for pertinent positives and negatives  Physical Exam BP (!) 159/99   Pulse 67   Temp 98.5 F (36.9 C) (Oral)   Resp 20   SpO2 100%   Physical Exam Vitals and nursing note reviewed.  Constitutional:      Appearance: Normal appearance.  HENT:     Head: Normocephalic and atraumatic.     Nose: Nose normal.     Mouth/Throat:     Mouth: Mucous membranes are moist.     Comments: Mild lip swelling, R>L, no angioedema of tongue, airway is clear Eyes:     Extraocular Movements: Extraocular movements intact.     Conjunctiva/sclera: Conjunctivae normal.  Cardiovascular:     Rate and Rhythm: Normal rate.  Pulmonary:     Effort: Pulmonary effort is normal.     Breath sounds: Normal breath sounds. No stridor.  Abdominal:     General: Abdomen is flat.     Palpations: Abdomen is soft.     Tenderness: There is no abdominal tenderness.  Musculoskeletal:  General: No swelling. Normal range of motion.     Cervical back: Neck supple.  Skin:    General: Skin is warm and dry.     Findings: No rash.  Neurological:     General: No focal deficit present.     Mental Status: She is alert.  Psychiatric:        Mood and Affect: Mood normal.     ED Results / Procedures / Treatments   EKG None  Procedures Procedures  Medications Ordered in the ED Medications  diphenhydrAMINE  (BENADRYL ) capsule 25 mg (25 mg Oral Given 02/10/24 0334)  predniSONE  (DELTASONE ) tablet 60 mg (60 mg Oral Given 02/10/24 0333)    Initial Impression and Plan  Patient here with localized lip swelling after a dose of Augmentin , will give steroids, antihistamines and monitor in the ED for  improvement. D/C augmentin , rx for clinda for her toothache.   ED Course   Clinical Course as of 02/10/24 0501  Wed Feb 10, 2024  0500 Patient reports improvement in swelling and eager to go home. Recommend she continue to use benadryl  if needed. Dental follow up as previously instructed. RTED for any other concerns.  [CS]    Clinical Course User Index [CS] Roselyn Carlin NOVAK, MD     MDM Rules/Calculators/A&P Medical Decision Making Problems Addressed: Allergic reaction, initial encounter: acute illness or injury  Risk Prescription drug management.     Final Clinical Impression(s) / ED Diagnoses Final diagnoses:  Allergic reaction, initial encounter    Rx / DC Orders ED Discharge Orders          Ordered    clindamycin  (CLEOCIN ) 150 MG capsule  4 times daily        02/10/24 0332             Roselyn Carlin NOVAK, MD 02/10/24 0501  "

## 2024-02-10 NOTE — ED Triage Notes (Signed)
 Pt. Was just d/c from facility. Received amoxicillin  prior to d/c and is now having allergic rx. Top lip swelling and feeling tingly. Pt. Is in NAD. Denies difficulty breathing and states tongue does not feel swollen.
